# Patient Record
Sex: Female | Born: 1935 | Race: White | Hispanic: No | Smoking: Never smoker
Health system: Southern US, Community
[De-identification: ages and names within clinical notes are randomized; demographics above are authoritative.]

## PROBLEM LIST (undated history)

## (undated) DIAGNOSIS — Z951 Presence of aortocoronary bypass graft: Secondary | ICD-10-CM

## (undated) DIAGNOSIS — I10 Essential (primary) hypertension: Secondary | ICD-10-CM

## (undated) DIAGNOSIS — F419 Anxiety disorder, unspecified: Secondary | ICD-10-CM

## (undated) DIAGNOSIS — E785 Hyperlipidemia, unspecified: Secondary | ICD-10-CM

## (undated) HISTORY — PX: BREAST EXCISIONAL BIOPSY: SUR124

---

## 2000-02-19 HISTORY — PX: CORONARY ARTERY BYPASS GRAFT: SHX141

## 2016-03-18 DIAGNOSIS — L578 Other skin changes due to chronic exposure to nonionizing radiation: Secondary | ICD-10-CM | POA: Diagnosis not present

## 2016-03-18 DIAGNOSIS — Z872 Personal history of diseases of the skin and subcutaneous tissue: Secondary | ICD-10-CM | POA: Diagnosis not present

## 2016-03-18 DIAGNOSIS — B0229 Other postherpetic nervous system involvement: Secondary | ICD-10-CM | POA: Diagnosis not present

## 2016-03-18 DIAGNOSIS — Z08 Encounter for follow-up examination after completed treatment for malignant neoplasm: Secondary | ICD-10-CM | POA: Diagnosis not present

## 2016-03-18 DIAGNOSIS — X32XXXA Exposure to sunlight, initial encounter: Secondary | ICD-10-CM | POA: Diagnosis not present

## 2016-03-18 DIAGNOSIS — L814 Other melanin hyperpigmentation: Secondary | ICD-10-CM | POA: Diagnosis not present

## 2016-03-18 DIAGNOSIS — L821 Other seborrheic keratosis: Secondary | ICD-10-CM | POA: Diagnosis not present

## 2016-03-18 DIAGNOSIS — Z85828 Personal history of other malignant neoplasm of skin: Secondary | ICD-10-CM | POA: Diagnosis not present

## 2016-05-16 DIAGNOSIS — I87321 Chronic venous hypertension (idiopathic) with inflammation of right lower extremity: Secondary | ICD-10-CM | POA: Diagnosis not present

## 2016-05-16 DIAGNOSIS — I2510 Atherosclerotic heart disease of native coronary artery without angina pectoris: Secondary | ICD-10-CM | POA: Diagnosis not present

## 2016-05-16 DIAGNOSIS — I83893 Varicose veins of bilateral lower extremities with other complications: Secondary | ICD-10-CM | POA: Diagnosis not present

## 2016-05-27 DIAGNOSIS — I83893 Varicose veins of bilateral lower extremities with other complications: Secondary | ICD-10-CM | POA: Diagnosis not present

## 2016-05-30 DIAGNOSIS — I10 Essential (primary) hypertension: Secondary | ICD-10-CM | POA: Diagnosis not present

## 2016-05-30 DIAGNOSIS — E559 Vitamin D deficiency, unspecified: Secondary | ICD-10-CM | POA: Diagnosis not present

## 2016-05-30 DIAGNOSIS — Z803 Family history of malignant neoplasm of breast: Secondary | ICD-10-CM | POA: Diagnosis not present

## 2016-05-30 DIAGNOSIS — Z6823 Body mass index (BMI) 23.0-23.9, adult: Secondary | ICD-10-CM | POA: Diagnosis not present

## 2016-05-30 DIAGNOSIS — I2510 Atherosclerotic heart disease of native coronary artery without angina pectoris: Secondary | ICD-10-CM | POA: Diagnosis not present

## 2016-05-30 DIAGNOSIS — C189 Malignant neoplasm of colon, unspecified: Secondary | ICD-10-CM | POA: Diagnosis not present

## 2016-05-30 DIAGNOSIS — F419 Anxiety disorder, unspecified: Secondary | ICD-10-CM | POA: Diagnosis not present

## 2016-05-30 DIAGNOSIS — M1990 Unspecified osteoarthritis, unspecified site: Secondary | ICD-10-CM | POA: Diagnosis not present

## 2016-05-30 DIAGNOSIS — E785 Hyperlipidemia, unspecified: Secondary | ICD-10-CM | POA: Diagnosis not present

## 2016-05-30 DIAGNOSIS — Z859 Personal history of malignant neoplasm, unspecified: Secondary | ICD-10-CM | POA: Diagnosis not present

## 2016-05-30 DIAGNOSIS — M810 Age-related osteoporosis without current pathological fracture: Secondary | ICD-10-CM | POA: Diagnosis not present

## 2016-06-04 DIAGNOSIS — R970 Elevated carcinoembryonic antigen [CEA]: Secondary | ICD-10-CM | POA: Diagnosis not present

## 2016-06-06 DIAGNOSIS — I10 Essential (primary) hypertension: Secondary | ICD-10-CM | POA: Diagnosis not present

## 2016-06-06 DIAGNOSIS — I83893 Varicose veins of bilateral lower extremities with other complications: Secondary | ICD-10-CM | POA: Diagnosis not present

## 2016-06-06 DIAGNOSIS — I87321 Chronic venous hypertension (idiopathic) with inflammation of right lower extremity: Secondary | ICD-10-CM | POA: Diagnosis not present

## 2016-06-06 DIAGNOSIS — E782 Mixed hyperlipidemia: Secondary | ICD-10-CM | POA: Diagnosis not present

## 2016-06-06 DIAGNOSIS — R600 Localized edema: Secondary | ICD-10-CM | POA: Diagnosis not present

## 2016-06-12 DIAGNOSIS — I872 Venous insufficiency (chronic) (peripheral): Secondary | ICD-10-CM | POA: Diagnosis not present

## 2016-06-12 DIAGNOSIS — I83893 Varicose veins of bilateral lower extremities with other complications: Secondary | ICD-10-CM | POA: Diagnosis not present

## 2016-07-10 DIAGNOSIS — I872 Venous insufficiency (chronic) (peripheral): Secondary | ICD-10-CM | POA: Diagnosis not present

## 2016-08-06 DIAGNOSIS — I83893 Varicose veins of bilateral lower extremities with other complications: Secondary | ICD-10-CM | POA: Diagnosis not present

## 2016-08-06 DIAGNOSIS — Z96652 Presence of left artificial knee joint: Secondary | ICD-10-CM | POA: Diagnosis not present

## 2016-08-06 DIAGNOSIS — Z85038 Personal history of other malignant neoplasm of large intestine: Secondary | ICD-10-CM | POA: Diagnosis not present

## 2016-08-16 DIAGNOSIS — M1990 Unspecified osteoarthritis, unspecified site: Secondary | ICD-10-CM | POA: Diagnosis not present

## 2016-08-16 DIAGNOSIS — E559 Vitamin D deficiency, unspecified: Secondary | ICD-10-CM | POA: Diagnosis not present

## 2016-08-16 DIAGNOSIS — M810 Age-related osteoporosis without current pathological fracture: Secondary | ICD-10-CM | POA: Diagnosis not present

## 2016-08-16 DIAGNOSIS — Z85038 Personal history of other malignant neoplasm of large intestine: Secondary | ICD-10-CM | POA: Diagnosis not present

## 2016-08-16 DIAGNOSIS — Z0001 Encounter for general adult medical examination with abnormal findings: Secondary | ICD-10-CM | POA: Diagnosis not present

## 2016-08-16 DIAGNOSIS — Z803 Family history of malignant neoplasm of breast: Secondary | ICD-10-CM | POA: Diagnosis not present

## 2016-08-16 DIAGNOSIS — I2510 Atherosclerotic heart disease of native coronary artery without angina pectoris: Secondary | ICD-10-CM | POA: Diagnosis not present

## 2016-08-16 DIAGNOSIS — F419 Anxiety disorder, unspecified: Secondary | ICD-10-CM | POA: Diagnosis not present

## 2016-08-16 DIAGNOSIS — E785 Hyperlipidemia, unspecified: Secondary | ICD-10-CM | POA: Diagnosis not present

## 2016-08-16 DIAGNOSIS — I10 Essential (primary) hypertension: Secondary | ICD-10-CM | POA: Diagnosis not present

## 2016-08-19 DIAGNOSIS — Z951 Presence of aortocoronary bypass graft: Secondary | ICD-10-CM | POA: Diagnosis not present

## 2016-08-19 DIAGNOSIS — R600 Localized edema: Secondary | ICD-10-CM | POA: Diagnosis not present

## 2016-08-19 DIAGNOSIS — I872 Venous insufficiency (chronic) (peripheral): Secondary | ICD-10-CM | POA: Diagnosis not present

## 2016-08-19 DIAGNOSIS — I10 Essential (primary) hypertension: Secondary | ICD-10-CM | POA: Diagnosis not present

## 2016-09-16 DIAGNOSIS — Z08 Encounter for follow-up examination after completed treatment for malignant neoplasm: Secondary | ICD-10-CM | POA: Diagnosis not present

## 2016-09-16 DIAGNOSIS — Z872 Personal history of diseases of the skin and subcutaneous tissue: Secondary | ICD-10-CM | POA: Diagnosis not present

## 2016-09-16 DIAGNOSIS — L578 Other skin changes due to chronic exposure to nonionizing radiation: Secondary | ICD-10-CM | POA: Diagnosis not present

## 2016-09-16 DIAGNOSIS — L821 Other seborrheic keratosis: Secondary | ICD-10-CM | POA: Diagnosis not present

## 2016-09-16 DIAGNOSIS — X32XXXA Exposure to sunlight, initial encounter: Secondary | ICD-10-CM | POA: Diagnosis not present

## 2016-09-16 DIAGNOSIS — Z85828 Personal history of other malignant neoplasm of skin: Secondary | ICD-10-CM | POA: Diagnosis not present

## 2016-09-18 DIAGNOSIS — N644 Mastodynia: Secondary | ICD-10-CM | POA: Diagnosis not present

## 2016-09-26 DIAGNOSIS — M2042 Other hammer toe(s) (acquired), left foot: Secondary | ICD-10-CM | POA: Diagnosis not present

## 2016-09-26 DIAGNOSIS — L600 Ingrowing nail: Secondary | ICD-10-CM | POA: Diagnosis not present

## 2016-09-26 DIAGNOSIS — M2012 Hallux valgus (acquired), left foot: Secondary | ICD-10-CM | POA: Diagnosis not present

## 2016-11-21 DIAGNOSIS — I890 Lymphedema, not elsewhere classified: Secondary | ICD-10-CM | POA: Diagnosis not present

## 2016-11-21 DIAGNOSIS — L89891 Pressure ulcer of other site, stage 1: Secondary | ICD-10-CM | POA: Diagnosis not present

## 2016-12-05 DIAGNOSIS — C189 Malignant neoplasm of colon, unspecified: Secondary | ICD-10-CM | POA: Diagnosis not present

## 2016-12-05 DIAGNOSIS — Z859 Personal history of malignant neoplasm, unspecified: Secondary | ICD-10-CM | POA: Diagnosis not present

## 2016-12-16 DIAGNOSIS — E7800 Pure hypercholesterolemia, unspecified: Secondary | ICD-10-CM | POA: Diagnosis not present

## 2016-12-16 DIAGNOSIS — M810 Age-related osteoporosis without current pathological fracture: Secondary | ICD-10-CM | POA: Diagnosis not present

## 2016-12-16 DIAGNOSIS — F419 Anxiety disorder, unspecified: Secondary | ICD-10-CM | POA: Diagnosis not present

## 2016-12-16 DIAGNOSIS — I10 Essential (primary) hypertension: Secondary | ICD-10-CM | POA: Diagnosis not present

## 2016-12-16 DIAGNOSIS — M159 Polyosteoarthritis, unspecified: Secondary | ICD-10-CM | POA: Diagnosis not present

## 2016-12-16 DIAGNOSIS — Z23 Encounter for immunization: Secondary | ICD-10-CM | POA: Diagnosis not present

## 2016-12-16 DIAGNOSIS — Z809 Family history of malignant neoplasm, unspecified: Secondary | ICD-10-CM | POA: Diagnosis not present

## 2016-12-16 DIAGNOSIS — D010 Carcinoma in situ of colon: Secondary | ICD-10-CM | POA: Diagnosis not present

## 2016-12-16 DIAGNOSIS — I2510 Atherosclerotic heart disease of native coronary artery without angina pectoris: Secondary | ICD-10-CM | POA: Diagnosis not present

## 2016-12-24 DIAGNOSIS — I872 Venous insufficiency (chronic) (peripheral): Secondary | ICD-10-CM | POA: Diagnosis not present

## 2016-12-24 DIAGNOSIS — I351 Nonrheumatic aortic (valve) insufficiency: Secondary | ICD-10-CM | POA: Diagnosis not present

## 2016-12-24 DIAGNOSIS — I2510 Atherosclerotic heart disease of native coronary artery without angina pectoris: Secondary | ICD-10-CM | POA: Diagnosis not present

## 2016-12-24 DIAGNOSIS — R600 Localized edema: Secondary | ICD-10-CM | POA: Diagnosis not present

## 2016-12-24 DIAGNOSIS — I340 Nonrheumatic mitral (valve) insufficiency: Secondary | ICD-10-CM | POA: Diagnosis not present

## 2016-12-25 DIAGNOSIS — R600 Localized edema: Secondary | ICD-10-CM | POA: Diagnosis not present

## 2017-01-01 DIAGNOSIS — K5790 Diverticulosis of intestine, part unspecified, without perforation or abscess without bleeding: Secondary | ICD-10-CM | POA: Diagnosis not present

## 2017-01-01 DIAGNOSIS — R970 Elevated carcinoembryonic antigen [CEA]: Secondary | ICD-10-CM | POA: Diagnosis not present

## 2017-01-07 DIAGNOSIS — I2510 Atherosclerotic heart disease of native coronary artery without angina pectoris: Secondary | ICD-10-CM | POA: Diagnosis not present

## 2017-01-22 DIAGNOSIS — R600 Localized edema: Secondary | ICD-10-CM | POA: Diagnosis not present

## 2017-01-22 DIAGNOSIS — I83893 Varicose veins of bilateral lower extremities with other complications: Secondary | ICD-10-CM | POA: Diagnosis not present

## 2017-01-22 DIAGNOSIS — I208 Other forms of angina pectoris: Secondary | ICD-10-CM | POA: Diagnosis not present

## 2017-01-23 DIAGNOSIS — I890 Lymphedema, not elsewhere classified: Secondary | ICD-10-CM | POA: Diagnosis not present

## 2017-01-23 DIAGNOSIS — L89891 Pressure ulcer of other site, stage 1: Secondary | ICD-10-CM | POA: Diagnosis not present

## 2017-02-13 DIAGNOSIS — R970 Elevated carcinoembryonic antigen [CEA]: Secondary | ICD-10-CM | POA: Diagnosis not present

## 2017-02-13 DIAGNOSIS — Z85038 Personal history of other malignant neoplasm of large intestine: Secondary | ICD-10-CM | POA: Diagnosis not present

## 2017-02-13 DIAGNOSIS — E119 Type 2 diabetes mellitus without complications: Secondary | ICD-10-CM | POA: Diagnosis not present

## 2017-02-13 DIAGNOSIS — Z79899 Other long term (current) drug therapy: Secondary | ICD-10-CM | POA: Diagnosis not present

## 2017-02-13 DIAGNOSIS — R933 Abnormal findings on diagnostic imaging of other parts of digestive tract: Secondary | ICD-10-CM | POA: Diagnosis not present

## 2017-02-13 DIAGNOSIS — I10 Essential (primary) hypertension: Secondary | ICD-10-CM | POA: Diagnosis not present

## 2017-02-13 DIAGNOSIS — Z800 Family history of malignant neoplasm of digestive organs: Secondary | ICD-10-CM | POA: Diagnosis not present

## 2017-02-13 DIAGNOSIS — I2510 Atherosclerotic heart disease of native coronary artery without angina pectoris: Secondary | ICD-10-CM | POA: Diagnosis not present

## 2017-02-13 DIAGNOSIS — R935 Abnormal findings on diagnostic imaging of other abdominal regions, including retroperitoneum: Secondary | ICD-10-CM | POA: Diagnosis not present

## 2017-06-19 DIAGNOSIS — M810 Age-related osteoporosis without current pathological fracture: Secondary | ICD-10-CM | POA: Diagnosis not present

## 2017-06-19 DIAGNOSIS — I2510 Atherosclerotic heart disease of native coronary artery without angina pectoris: Secondary | ICD-10-CM | POA: Diagnosis not present

## 2017-06-19 DIAGNOSIS — M1990 Unspecified osteoarthritis, unspecified site: Secondary | ICD-10-CM | POA: Diagnosis not present

## 2017-06-19 DIAGNOSIS — F419 Anxiety disorder, unspecified: Secondary | ICD-10-CM | POA: Diagnosis not present

## 2017-06-19 DIAGNOSIS — I10 Essential (primary) hypertension: Secondary | ICD-10-CM | POA: Diagnosis not present

## 2017-06-19 DIAGNOSIS — Z951 Presence of aortocoronary bypass graft: Secondary | ICD-10-CM | POA: Diagnosis not present

## 2017-06-23 DIAGNOSIS — I2510 Atherosclerotic heart disease of native coronary artery without angina pectoris: Secondary | ICD-10-CM | POA: Diagnosis not present

## 2017-06-23 DIAGNOSIS — I10 Essential (primary) hypertension: Secondary | ICD-10-CM | POA: Diagnosis not present

## 2017-06-23 DIAGNOSIS — Z951 Presence of aortocoronary bypass graft: Secondary | ICD-10-CM | POA: Diagnosis not present

## 2017-06-23 DIAGNOSIS — M810 Age-related osteoporosis without current pathological fracture: Secondary | ICD-10-CM | POA: Diagnosis not present

## 2017-06-23 DIAGNOSIS — F419 Anxiety disorder, unspecified: Secondary | ICD-10-CM | POA: Diagnosis not present

## 2017-06-23 DIAGNOSIS — M1990 Unspecified osteoarthritis, unspecified site: Secondary | ICD-10-CM | POA: Diagnosis not present

## 2017-06-24 DIAGNOSIS — J3501 Chronic tonsillitis: Secondary | ICD-10-CM | POA: Diagnosis not present

## 2017-06-24 DIAGNOSIS — J351 Hypertrophy of tonsils: Secondary | ICD-10-CM | POA: Diagnosis not present

## 2017-06-26 DIAGNOSIS — Z951 Presence of aortocoronary bypass graft: Secondary | ICD-10-CM | POA: Diagnosis not present

## 2017-06-26 DIAGNOSIS — I2510 Atherosclerotic heart disease of native coronary artery without angina pectoris: Secondary | ICD-10-CM | POA: Diagnosis not present

## 2017-06-26 DIAGNOSIS — F419 Anxiety disorder, unspecified: Secondary | ICD-10-CM | POA: Diagnosis not present

## 2017-06-26 DIAGNOSIS — E7800 Pure hypercholesterolemia, unspecified: Secondary | ICD-10-CM | POA: Diagnosis not present

## 2017-06-26 DIAGNOSIS — R5383 Other fatigue: Secondary | ICD-10-CM | POA: Diagnosis not present

## 2017-06-26 DIAGNOSIS — Z85038 Personal history of other malignant neoplasm of large intestine: Secondary | ICD-10-CM | POA: Diagnosis not present

## 2017-06-26 DIAGNOSIS — I10 Essential (primary) hypertension: Secondary | ICD-10-CM | POA: Diagnosis not present

## 2017-06-26 DIAGNOSIS — M1990 Unspecified osteoarthritis, unspecified site: Secondary | ICD-10-CM | POA: Diagnosis not present

## 2017-06-26 DIAGNOSIS — M810 Age-related osteoporosis without current pathological fracture: Secondary | ICD-10-CM | POA: Diagnosis not present

## 2017-06-26 DIAGNOSIS — E559 Vitamin D deficiency, unspecified: Secondary | ICD-10-CM | POA: Diagnosis not present

## 2017-07-01 DIAGNOSIS — M810 Age-related osteoporosis without current pathological fracture: Secondary | ICD-10-CM | POA: Diagnosis not present

## 2017-07-01 DIAGNOSIS — I2510 Atherosclerotic heart disease of native coronary artery without angina pectoris: Secondary | ICD-10-CM | POA: Diagnosis not present

## 2017-07-01 DIAGNOSIS — Z951 Presence of aortocoronary bypass graft: Secondary | ICD-10-CM | POA: Diagnosis not present

## 2017-07-01 DIAGNOSIS — M1990 Unspecified osteoarthritis, unspecified site: Secondary | ICD-10-CM | POA: Diagnosis not present

## 2017-07-01 DIAGNOSIS — F419 Anxiety disorder, unspecified: Secondary | ICD-10-CM | POA: Diagnosis not present

## 2017-07-01 DIAGNOSIS — I10 Essential (primary) hypertension: Secondary | ICD-10-CM | POA: Diagnosis not present

## 2017-07-03 DIAGNOSIS — I10 Essential (primary) hypertension: Secondary | ICD-10-CM | POA: Diagnosis not present

## 2017-07-03 DIAGNOSIS — F419 Anxiety disorder, unspecified: Secondary | ICD-10-CM | POA: Diagnosis not present

## 2017-07-03 DIAGNOSIS — M810 Age-related osteoporosis without current pathological fracture: Secondary | ICD-10-CM | POA: Diagnosis not present

## 2017-07-03 DIAGNOSIS — M1990 Unspecified osteoarthritis, unspecified site: Secondary | ICD-10-CM | POA: Diagnosis not present

## 2017-07-03 DIAGNOSIS — Z951 Presence of aortocoronary bypass graft: Secondary | ICD-10-CM | POA: Diagnosis not present

## 2017-07-03 DIAGNOSIS — I2510 Atherosclerotic heart disease of native coronary artery without angina pectoris: Secondary | ICD-10-CM | POA: Diagnosis not present

## 2017-07-08 DIAGNOSIS — I10 Essential (primary) hypertension: Secondary | ICD-10-CM | POA: Diagnosis not present

## 2017-07-08 DIAGNOSIS — I2510 Atherosclerotic heart disease of native coronary artery without angina pectoris: Secondary | ICD-10-CM | POA: Diagnosis not present

## 2017-07-08 DIAGNOSIS — Z951 Presence of aortocoronary bypass graft: Secondary | ICD-10-CM | POA: Diagnosis not present

## 2017-07-08 DIAGNOSIS — M810 Age-related osteoporosis without current pathological fracture: Secondary | ICD-10-CM | POA: Diagnosis not present

## 2017-07-08 DIAGNOSIS — F419 Anxiety disorder, unspecified: Secondary | ICD-10-CM | POA: Diagnosis not present

## 2017-07-08 DIAGNOSIS — M1990 Unspecified osteoarthritis, unspecified site: Secondary | ICD-10-CM | POA: Diagnosis not present

## 2017-07-09 DIAGNOSIS — F341 Dysthymic disorder: Secondary | ICD-10-CM | POA: Diagnosis not present

## 2017-07-09 DIAGNOSIS — E782 Mixed hyperlipidemia: Secondary | ICD-10-CM | POA: Diagnosis not present

## 2017-07-09 DIAGNOSIS — I10 Essential (primary) hypertension: Secondary | ICD-10-CM | POA: Diagnosis not present

## 2017-07-09 DIAGNOSIS — R600 Localized edema: Secondary | ICD-10-CM | POA: Diagnosis not present

## 2017-07-09 DIAGNOSIS — I208 Other forms of angina pectoris: Secondary | ICD-10-CM | POA: Diagnosis not present

## 2017-07-10 DIAGNOSIS — M1990 Unspecified osteoarthritis, unspecified site: Secondary | ICD-10-CM | POA: Diagnosis not present

## 2017-07-10 DIAGNOSIS — M810 Age-related osteoporosis without current pathological fracture: Secondary | ICD-10-CM | POA: Diagnosis not present

## 2017-07-10 DIAGNOSIS — F419 Anxiety disorder, unspecified: Secondary | ICD-10-CM | POA: Diagnosis not present

## 2017-07-10 DIAGNOSIS — I10 Essential (primary) hypertension: Secondary | ICD-10-CM | POA: Diagnosis not present

## 2017-07-10 DIAGNOSIS — I2510 Atherosclerotic heart disease of native coronary artery without angina pectoris: Secondary | ICD-10-CM | POA: Diagnosis not present

## 2017-07-10 DIAGNOSIS — Z951 Presence of aortocoronary bypass graft: Secondary | ICD-10-CM | POA: Diagnosis not present

## 2017-07-16 DIAGNOSIS — Z951 Presence of aortocoronary bypass graft: Secondary | ICD-10-CM | POA: Diagnosis not present

## 2017-07-16 DIAGNOSIS — M810 Age-related osteoporosis without current pathological fracture: Secondary | ICD-10-CM | POA: Diagnosis not present

## 2017-07-16 DIAGNOSIS — F419 Anxiety disorder, unspecified: Secondary | ICD-10-CM | POA: Diagnosis not present

## 2017-07-16 DIAGNOSIS — I10 Essential (primary) hypertension: Secondary | ICD-10-CM | POA: Diagnosis not present

## 2017-07-16 DIAGNOSIS — M1990 Unspecified osteoarthritis, unspecified site: Secondary | ICD-10-CM | POA: Diagnosis not present

## 2017-07-16 DIAGNOSIS — I2510 Atherosclerotic heart disease of native coronary artery without angina pectoris: Secondary | ICD-10-CM | POA: Diagnosis not present

## 2017-07-21 DIAGNOSIS — Z0001 Encounter for general adult medical examination with abnormal findings: Secondary | ICD-10-CM | POA: Diagnosis not present

## 2017-07-21 DIAGNOSIS — Z6821 Body mass index (BMI) 21.0-21.9, adult: Secondary | ICD-10-CM | POA: Diagnosis not present

## 2017-07-21 DIAGNOSIS — Z85038 Personal history of other malignant neoplasm of large intestine: Secondary | ICD-10-CM | POA: Diagnosis not present

## 2017-07-21 DIAGNOSIS — I10 Essential (primary) hypertension: Secondary | ICD-10-CM | POA: Diagnosis not present

## 2017-07-21 DIAGNOSIS — H903 Sensorineural hearing loss, bilateral: Secondary | ICD-10-CM | POA: Diagnosis not present

## 2017-07-21 DIAGNOSIS — M810 Age-related osteoporosis without current pathological fracture: Secondary | ICD-10-CM | POA: Diagnosis not present

## 2017-07-22 DIAGNOSIS — I10 Essential (primary) hypertension: Secondary | ICD-10-CM | POA: Diagnosis not present

## 2017-07-22 DIAGNOSIS — M1990 Unspecified osteoarthritis, unspecified site: Secondary | ICD-10-CM | POA: Diagnosis not present

## 2017-07-22 DIAGNOSIS — M810 Age-related osteoporosis without current pathological fracture: Secondary | ICD-10-CM | POA: Diagnosis not present

## 2017-07-22 DIAGNOSIS — I2510 Atherosclerotic heart disease of native coronary artery without angina pectoris: Secondary | ICD-10-CM | POA: Diagnosis not present

## 2017-07-22 DIAGNOSIS — F419 Anxiety disorder, unspecified: Secondary | ICD-10-CM | POA: Diagnosis not present

## 2017-07-22 DIAGNOSIS — Z951 Presence of aortocoronary bypass graft: Secondary | ICD-10-CM | POA: Diagnosis not present

## 2017-08-26 DIAGNOSIS — Z7189 Other specified counseling: Secondary | ICD-10-CM | POA: Diagnosis not present

## 2017-08-26 DIAGNOSIS — I10 Essential (primary) hypertension: Secondary | ICD-10-CM | POA: Diagnosis not present

## 2017-08-26 DIAGNOSIS — E7800 Pure hypercholesterolemia, unspecified: Secondary | ICD-10-CM | POA: Diagnosis not present

## 2017-08-26 DIAGNOSIS — R011 Cardiac murmur, unspecified: Secondary | ICD-10-CM | POA: Diagnosis not present

## 2017-09-23 ENCOUNTER — Ambulatory Visit: Payer: Self-pay | Admitting: Podiatry

## 2017-10-09 ENCOUNTER — Ambulatory Visit: Payer: Self-pay | Admitting: Podiatry

## 2017-10-14 ENCOUNTER — Ambulatory Visit: Payer: Medicare Other | Admitting: Podiatry

## 2017-10-14 DIAGNOSIS — Z951 Presence of aortocoronary bypass graft: Secondary | ICD-10-CM | POA: Insufficient documentation

## 2017-10-14 DIAGNOSIS — M7742 Metatarsalgia, left foot: Secondary | ICD-10-CM

## 2017-10-14 DIAGNOSIS — M79674 Pain in right toe(s): Secondary | ICD-10-CM

## 2017-10-14 DIAGNOSIS — Z96653 Presence of artificial knee joint, bilateral: Secondary | ICD-10-CM | POA: Insufficient documentation

## 2017-10-14 DIAGNOSIS — B351 Tinea unguium: Secondary | ICD-10-CM | POA: Diagnosis not present

## 2017-10-14 DIAGNOSIS — M79675 Pain in left toe(s): Secondary | ICD-10-CM | POA: Diagnosis not present

## 2017-10-14 DIAGNOSIS — M7741 Metatarsalgia, right foot: Secondary | ICD-10-CM

## 2017-10-14 DIAGNOSIS — Z90710 Acquired absence of both cervix and uterus: Secondary | ICD-10-CM | POA: Insufficient documentation

## 2017-10-14 DIAGNOSIS — C189 Malignant neoplasm of colon, unspecified: Secondary | ICD-10-CM | POA: Insufficient documentation

## 2017-10-14 DIAGNOSIS — M216X9 Other acquired deformities of unspecified foot: Secondary | ICD-10-CM

## 2017-10-16 NOTE — Progress Notes (Signed)
Subjective:   Patient ID: Christy Marquez, female   DOB: 84 y.o.   MRN: [id number]   HPI 84 year old female presents the office today for concerns of thick, painful, elongated nails that she cannot trim herself.  She also has some burning to her feet she gets some discomfort submetatarsal area.  She recently just moved here from [place] and she was seen a podiatrist down there.  She is not diabetic.  She has no other concerns today.  Review of Systems  All other systems reviewed and are negative.  No past medical history on file.     Current Outpatient Medications:  .  ALPRAZolam (XANAX) 0.5 MG tablet, Take 0.5 mg by mouth at bedtime as needed for anxiety., Disp: , Rfl:  .  cloNIDine (CATAPRES - DOSED IN MG/24 HR) 0.2 mg/24hr patch, Place 0.2 mg onto the skin once a week., Disp: , Rfl:  .  furosemide (LASIX) 20 MG tablet, Take 20 mg by mouth., Disp: , Rfl:  .  irbesartan (AVAPRO) 150 MG tablet, Take 150 mg by mouth daily., Disp: , Rfl:  .  metoprolol succinate (TOPROL-XL) 100 MG 24 hr tablet, Take 100 mg by mouth daily. Take with or immediately following a meal., Disp: , Rfl:  .  potassium chloride (K-DUR,KLOR-CON) 10 MEQ tablet, Take 10 mEq by mouth 2 (two) times daily., Disp: , Rfl:  .  simvastatin (ZOCOR) 20 MG tablet, Take 20 mg by mouth daily., Disp: , Rfl:  .  traMADol (ULTRAM) 50 MG tablet, Take by mouth every 6 (six) hours as needed., Disp: , Rfl:   Allergies  Allergen Reactions  . Darvon [Propoxyphene]   . Demerol [Meperidine Hcl]          Objective:  Physical Exam  General: AAO x3, NAD  Dermatological: Nails are hypertrophic, dystrophic, brittle, discolored, elongated 10. No surrounding redness or drainage. Tenderness nails 1-5 bilaterally. No open lesions or pre-ulcerative lesions are identified today.  Vascular: Dorsalis Pedis artery and Posterior Tibial artery pedal pulses are 2/4 bilateral with immedate capillary fill time. There is no pain with calf  compression, swelling, warmth, erythema.   Neruologic: Grossly intact via light touch bilateral. Protective threshold with Semmes Wienstein monofilament intact to all pedal sites bilateral.   Musculoskeletal: Prominent metatarsal heads plantarly.  Subjectively this is where she gets burning on the balls of the foot.  There is no area pinpoint tenderness.  Muscular strength 5/5 in all groups tested bilateral.  Gait: Unassisted, Nonantalgic.       Assessment:   84 year old female symptomatic onychomycosis, metatarsalgia, neuritis symptoms     Plan:  -Treatment options discussed including all alternatives, risks, and complications -Etiology of symptoms were discussed -Nails were debrided x10 without any complications or bleeding. -Ultimately we discussed some kind of orthotic to help offload the metatarsal heads.  She is getting pain to this area and she was able to walk better.  We discussed metatarsal pads we also discussed an insert.  Rick evaluate her today for orthotics. -Daily foot inspection  Trula Slade DPM

## 2017-10-18 ENCOUNTER — Emergency Department (HOSPITAL_COMMUNITY)
Admission: EM | Admit: 2017-10-18 | Discharge: 2017-10-18 | Disposition: A | Discharge status: Home / Self Care | Payer: Medicare Other | Attending: Emergency Medicine | Admitting: Emergency Medicine

## 2017-10-18 ENCOUNTER — Encounter (HOSPITAL_COMMUNITY): Payer: Self-pay

## 2017-10-18 ENCOUNTER — Emergency Department (HOSPITAL_COMMUNITY): Payer: Medicare Other

## 2017-10-18 DIAGNOSIS — R0789 Other chest pain: Secondary | ICD-10-CM | POA: Diagnosis not present

## 2017-10-18 DIAGNOSIS — I10 Essential (primary) hypertension: Secondary | ICD-10-CM | POA: Diagnosis not present

## 2017-10-18 DIAGNOSIS — Z79899 Other long term (current) drug therapy: Secondary | ICD-10-CM | POA: Diagnosis not present

## 2017-10-18 DIAGNOSIS — R609 Edema, unspecified: Secondary | ICD-10-CM | POA: Diagnosis not present

## 2017-10-18 DIAGNOSIS — R079 Chest pain, unspecified: Secondary | ICD-10-CM | POA: Diagnosis not present

## 2017-10-18 DIAGNOSIS — R0902 Hypoxemia: Secondary | ICD-10-CM | POA: Diagnosis not present

## 2017-10-18 HISTORY — DX: Presence of aortocoronary bypass graft: Z95.1

## 2017-10-18 HISTORY — DX: Essential (primary) hypertension: I10

## 2017-10-18 HISTORY — DX: Hyperlipidemia, unspecified: E78.5

## 2017-10-18 HISTORY — DX: Anxiety disorder, unspecified: F41.9

## 2017-10-18 LAB — BASIC METABOLIC PANEL
Anion gap: 10 (ref 5–15)
BUN: 16 mg/dL (ref 8–23)
CO2: 28 mmol/L (ref 22–32)
Calcium: 9.3 mg/dL (ref 8.9–10.3)
Chloride: 99 mmol/L (ref 98–111)
Creatinine, Ser: 0.76 mg/dL (ref 0.44–1.00)
GFR calc Af Amer: >60 mL/min (ref >60)
GFR calc non Af Amer: >60 mL/min (ref >60)
Glucose, Bld: 97 mg/dL (ref 70–99)
Potassium: 4 mmol/L (ref 3.5–5.1)
Sodium: 137 mmol/L (ref 135–145)

## 2017-10-18 LAB — CBC WITH DIFFERENTIAL/PLATELET
Abs Immature Granulocytes: 0 10*3/uL (ref 0.0–0.1)
Basophils Absolute: 0.1 10*3/uL (ref 0.0–0.1)
Basophils Relative: 2 %
Eosinophils Absolute: 0.1 10*3/uL (ref 0.0–0.7)
Eosinophils Relative: 2 %
HCT: 39.2 % (ref 36.0–46.0)
Hemoglobin: 12.8 g/dL (ref 12.0–15.0)
Immature Granulocytes: 0 %
Lymphocytes Relative: 32 %
Lymphs Abs: 1.8 10*3/uL (ref 0.7–4.0)
MCH: 31.8 pg (ref 26.0–34.0)
MCHC: 32.7 g/dL (ref 30.0–36.0)
MCV: 97.3 fL (ref 78.0–100.0)
Monocytes Absolute: 0.4 10*3/uL (ref 0.1–1.0)
Monocytes Relative: 8 %
Neutro Abs: 3.1 10*3/uL (ref 1.7–7.7)
Neutrophils Relative %: 56 %
Platelets: 224 10*3/uL (ref 150–400)
RBC: 4.03 MIL/uL (ref 3.87–5.11)
RDW: 12.8 % (ref 11.5–15.5)
WBC: 5.5 10*3/uL (ref 4.0–10.5)

## 2017-10-18 LAB — I-STAT TROPONIN, ED
Troponin i, poc: 0.01 ng/mL (ref 0.00–0.08)
Troponin i, poc: 0.01 ng/mL (ref 0.00–0.08)

## 2017-10-18 MED ORDER — CLONIDINE HCL 0.1 MG PO TABS
0.1000 mg | ORAL_TABLET | Freq: Every day | ORAL | Status: DC
  Administered 2017-10-18: 0.1 mg via ORAL
  Filled 2017-10-18: qty 1

## 2017-10-18 MED ORDER — METOPROLOL SUCCINATE ER 100 MG PO TB24
100.0000 mg | ORAL_TABLET | Freq: Every day | ORAL | Status: DC
  Filled 2017-10-18: qty 1

## 2017-10-18 NOTE — ED Notes (Signed)
Patient transported to X-ray 

## 2017-10-18 NOTE — ED Provider Notes (Signed)
[facility] EMERGENCY DEPARTMENT Provider Note   CSN: [id number] Arrival date & time: 10/18/17  9794     History   Chief Complaint Chief Complaint  Patient presents with  . Chest Pain    HPI Christy Marquez is a 84 y.o. female.  63 y oF with a chief complaint of hypertension.  Patient has been checking her blood pressure off and on and has been worried that it is been elevated.  This been an ongoing issue for her.  She talked about her living in [place] and having the same issue.  She had checked it multiple times at some points and went to the ED there and was treated.  She has seen her family physician somewhat recently for the same.  There are no changes to her medications.  She woke up this morning and felt that her blood pressure must be elevated and she has checked it and the systolic was in the 801K and the diastolic was in the 55V.  She was walking to the house and had a sharp pain to the left side of her chest that lasted for less than a second.  She otherwise denies any symptoms.  Denies shortness of breath denies headache denies neck pain denies unilateral numbness or weakness denies abdominal pain nausea or vomiting.  The history is provided by the patient.  Chest Pain   This is a new problem. The current episode started 3 to 5 hours ago. The problem occurs rarely. The problem has been resolved. The pain is present in the lateral region. The pain is at a severity of 10/10. The pain is severe. The quality of the pain is described as brief and sharp. The pain does not radiate. Duration of episode(s) is 1 second. Pertinent negatives include no dizziness, no fever, no headaches, no nausea, no palpitations, no shortness of breath and no vomiting. She has tried nothing for the symptoms. The treatment provided no relief.  Her past medical history is significant for MI.    Past Medical History:  Diagnosis Date  . Anxiety   . Hx of CABG    Per pt 2002  .  Hyperlipidemia   . Hypertension     Patient Active Problem List   Diagnosis Date Noted  . History of total bilateral knee replacement 10/14/2017  . Colon cancer ([place]) 10/14/2017  . History of heart bypass surgery 10/14/2017  . H/O: hysterectomy 10/14/2017    Past Surgical History:  Procedure Laterality Date  . CORONARY ARTERY BYPASS GRAFT  2002     OB History   None      Home Medications    Prior to Admission medications   Medication Sig Start Date End Date Taking? Authorizing Provider  ALPRAZolam Duanne Moron) 0.5 MG tablet Take 0.5 mg by mouth at bedtime as needed for anxiety.   Yes [provider]  busPIRone (BUSPAR) 5 MG tablet Take 5 mg by mouth 2 (two) times daily. 10/14/17  Yes [provider]  cloNIDine (CATAPRES) 0.1 MG tablet Take 0.1 mg by mouth daily.   Yes [provider]  furosemide (LASIX) 20 MG tablet Take 20 mg by mouth daily.    Yes [provider]  irbesartan (AVAPRO) 150 MG tablet Take 150 mg by mouth daily.   Yes [provider]  metoprolol succinate (TOPROL-XL) 100 MG 24 hr tablet Take 100 mg by mouth daily. Take with or immediately following a meal.   Yes [provider]  potassium  chloride (K-DUR,KLOR-CON) 10 MEQ tablet Take 10 mEq by mouth daily. With food   Yes [provider]  simvastatin (ZOCOR) 20 MG tablet Take 20 mg by mouth daily.   Yes [provider]  traMADol (ULTRAM) 50 MG tablet Take 50 mg by mouth 2 (two) times daily as needed for moderate pain.    Yes [provider]  cloNIDine (CATAPRES - DOSED IN MG/24 HR) 0.2 mg/24hr patch Place 0.2 mg onto the skin once a week.    [provider]    Family History History reviewed. No pertinent family history.  Social History Social History   Tobacco Use  . Smoking status: Not on file  Substance Use Topics  . Alcohol use: Not on file  . Drug use: Not on file     Allergies   Darvon [propoxyphene] and  Demerol [meperidine hcl]   Review of Systems Review of Systems  Constitutional: Negative for chills and fever.  HENT: Negative for congestion and rhinorrhea.   Eyes: Negative for redness and visual disturbance.  Respiratory: Negative for shortness of breath and wheezing.   Cardiovascular: Positive for chest pain. Negative for palpitations.  Gastrointestinal: Negative for nausea and vomiting.  Genitourinary: Negative for dysuria and urgency.  Musculoskeletal: Negative for arthralgias and myalgias.  Skin: Negative for pallor and wound.  Neurological: Negative for dizziness and headaches.     Physical Exam Updated Vital Signs BP (!) 189/98   Pulse 60   Resp 16   SpO2 96%   Physical Exam  Constitutional: She is oriented to person, place, and time. She appears well-developed and well-nourished. No distress.  HENT:  Head: Normocephalic and atraumatic.  Eyes: Pupils are equal, round, and reactive to light. EOM are normal.  Neck: Normal range of motion. Neck supple.  Cardiovascular: Normal rate and regular rhythm. Exam reveals no gallop and no friction rub.  No murmur heard. Pulmonary/Chest: Effort normal. She has no wheezes. She has no rales. She exhibits tenderness ( Palpation of the left anterior chest wall about the lateral clavicular line ribs 2 through 5 reproduce her pain).  Abdominal: Soft. She exhibits no distension. There is no tenderness.  Musculoskeletal: She exhibits no edema or tenderness.  Neurological: She is alert and oriented to person, place, and time.  Skin: Skin is warm and dry. She is not diaphoretic.  Psychiatric: She has a normal mood and affect. Her behavior is normal.  Nursing note and vitals reviewed.    ED Treatments / Results  Labs (all labs ordered are listed, but only abnormal results are displayed) Labs Reviewed  CBC WITH DIFFERENTIAL/PLATELET  BASIC METABOLIC PANEL  I-STAT TROPONIN, ED  I-STAT TROPONIN, ED    EKG EKG  Interpretation  Date/Time:  Saturday October 18 2017 09:01:47 EDT Ventricular Rate:  56 PR Interval:    QRS Duration: 104 QT Interval:  460 QTC Calculation: 444 R Axis:   33 Text Interpretation:  Sinus rhythm Borderline prolonged PR interval Probable LVH with secondary repol abnrm No old tracing to compare Confirmed by Deno Etienne [phone]) on 10/18/2017 9:42:08 AM   Radiology Dg Chest 2 View  Result Date: 10/18/2017 CLINICAL DATA:  Chest pain EXAM: CHEST - 2 VIEW COMPARISON:  None. FINDINGS: Normal heart size. Mild aortic tortuosity. Prior median sternotomy. Prominent interstitial markings that may be calcified bronchi. There is no edema, consolidation, effusion, or pneumothorax. Exaggerated thoracic kyphosis and osteopenia. IMPRESSION: No acute finding. Electronically Signed   By: Monte Fantasia M.D.   On: 10/18/2017  10:18    Procedures Procedures (including critical care time)  Medications Ordered in ED Medications  metoprolol succinate (TOPROL-XL) 24 hr tablet 100 mg (100 mg Oral Refused 10/18/17 1420)  cloNIDine (CATAPRES) tablet 0.1 mg (0.1 mg Oral Given 10/18/17 1417)     Initial Impression / Assessment and Plan / ED Course  I have reviewed the triage vital signs and the nursing notes.  Pertinent labs & imaging results that were available during my care of the patient were reviewed by me and considered in my medical decision making (see chart for details).     84 yo F with a chief complaint of hypertension.  The patient also had transient chest pain.  This pain is very atypical of ACS.  Is reproducible on exam.  Based on her age and risk factors I will obtain a delta troponin.  Her initial EKG is unremarkable chest x-ray with no pneumothorax or focal infiltrate.  The patient is mostly concerned about her blood pressure.  She has been checking it at home.  She has multiple medications and is supposed to check her blood pressure after taking it in the morning but she checked it  before taking her medicines and then wanted to come in here without taking her medicine so she could show [place] what the number was.  She has no neurologic deficit on my exam.  No edema, I feel unlikely that the patient is having a hypertensive emergency.  Delta negative.  D/c home.   3:15 PM:  I have discussed the diagnosis/risks/treatment options with the patient and family and believe the pt to be eligible for discharge home to follow-up with PCP. We also discussed returning to the ED immediately if new or worsening sx occur. We discussed the sx which are most concerning (e.g., sudden worsening pain, fever, inability to tolerate by mouth) that necessitate immediate return. Medications administered to the patient during their visit and any new prescriptions provided to the patient are listed below.  Medications given during this visit Medications  metoprolol succinate (TOPROL-XL) 24 hr tablet 100 mg (100 mg Oral Refused 10/18/17 1420)  cloNIDine (CATAPRES) tablet 0.1 mg (0.1 mg Oral Given 10/18/17 1417)      The patient appears reasonably screen and/or stabilized for discharge and I doubt any other medical condition or other [facility] requiring further screening, evaluation, or treatment in the ED at this time prior to discharge.    Final Clinical Impressions(s) / ED Diagnoses   Final diagnoses:  Atypical chest pain  Essential hypertension    ED Discharge Orders    None       Deno Etienne, DO 10/18/17 1515

## 2017-10-18 NOTE — Discharge Instructions (Signed)
Chest you blood pressure as directed by your PCP.  Return to the ed for worsening chest pain, shortness of breath.

## 2017-10-18 NOTE — ED Triage Notes (Signed)
Pt arrived via [facility]; pt from [facility] with c/o CP centralized , radiating to neck and L shoulder, starting at 0630. Pt with hx of CABG 2002. Pt also has hx of HTN and has been hypertensive last few days ago and took took clonidine with presence of family member who is a Marine scientist. 192/87, 60, 95% on RA, 98.1 T.

## 2017-10-18 NOTE — ED Notes (Signed)
Pt discharged from ED; instructions provided; Pt encouraged to return to ED if symptoms worsen and to f/u with PCP; Pt verbalized understanding of all instructions 

## 2017-11-03 ENCOUNTER — Telehealth: Payer: Self-pay | Admitting: Podiatry

## 2017-11-03 NOTE — Telephone Encounter (Signed)
pts niece Zigmund Gottron left message checking on status of orthotics.  I returned call and left a message that they have just came in and to call me back to schedule an appt.

## 2017-11-05 ENCOUNTER — Ambulatory Visit: Payer: Medicare Other | Admitting: Orthotics

## 2017-11-05 DIAGNOSIS — M216X9 Other acquired deformities of unspecified foot: Secondary | ICD-10-CM

## 2017-11-05 DIAGNOSIS — M7741 Metatarsalgia, right foot: Secondary | ICD-10-CM

## 2017-11-05 DIAGNOSIS — M7742 Metatarsalgia, left foot: Principal | ICD-10-CM

## 2017-11-05 NOTE — Progress Notes (Signed)
Patient came in today to pick up custom made foot orthotics.  The goals were accomplished and the patient reported no dissatisfaction with said orthotics.  Patient was advised of breakin period and how to report any issues. 

## 2017-11-24 ENCOUNTER — Other Ambulatory Visit: Payer: Self-pay | Admitting: Geriatric Medicine

## 2017-11-24 DIAGNOSIS — R1319 Other dysphagia: Secondary | ICD-10-CM

## 2017-11-24 DIAGNOSIS — R1310 Dysphagia, unspecified: Secondary | ICD-10-CM

## 2017-11-25 ENCOUNTER — Other Ambulatory Visit: Payer: Medicare Other

## 2017-11-28 ENCOUNTER — Ambulatory Visit
Admission: RE | Admit: 2017-11-28 | Discharge: 2017-11-28 | Disposition: A | Discharge status: Home / Self Care | Payer: Medicare Other | Source: Ambulatory Visit | Attending: Geriatric Medicine | Admitting: Geriatric Medicine

## 2017-11-28 ENCOUNTER — Other Ambulatory Visit: Payer: Medicare Other

## 2017-11-28 DIAGNOSIS — R1310 Dysphagia, unspecified: Secondary | ICD-10-CM

## 2017-11-28 DIAGNOSIS — R1319 Other dysphagia: Secondary | ICD-10-CM

## 2018-02-02 ENCOUNTER — Other Ambulatory Visit: Payer: Self-pay | Admitting: Geriatric Medicine

## 2018-02-02 DIAGNOSIS — Z1231 Encounter for screening mammogram for malignant neoplasm of breast: Secondary | ICD-10-CM

## 2018-03-11 ENCOUNTER — Ambulatory Visit: Payer: Medicare Other

## 2018-04-02 DIAGNOSIS — R079 Chest pain, unspecified: Secondary | ICD-10-CM | POA: Insufficient documentation

## 2018-04-02 DIAGNOSIS — I878 Other specified disorders of veins: Secondary | ICD-10-CM | POA: Insufficient documentation

## 2018-04-02 DIAGNOSIS — I10 Essential (primary) hypertension: Secondary | ICD-10-CM | POA: Insufficient documentation

## 2018-04-03 ENCOUNTER — Ambulatory Visit: Payer: Medicare Other

## 2018-05-18 ENCOUNTER — Inpatient Hospital Stay: Admission: RE | Admit: 2018-05-18 | Payer: Medicare Other | Source: Ambulatory Visit

## 2018-06-16 ENCOUNTER — Ambulatory Visit: Payer: Medicare Other

## 2018-06-30 DIAGNOSIS — E785 Hyperlipidemia, unspecified: Secondary | ICD-10-CM | POA: Insufficient documentation

## 2018-06-30 DIAGNOSIS — S72052A Unspecified fracture of head of left femur, initial encounter for closed fracture: Secondary | ICD-10-CM | POA: Insufficient documentation

## 2018-06-30 DIAGNOSIS — I2510 Atherosclerotic heart disease of native coronary artery without angina pectoris: Secondary | ICD-10-CM | POA: Insufficient documentation

## 2018-07-01 DIAGNOSIS — E876 Hypokalemia: Secondary | ICD-10-CM | POA: Insufficient documentation

## 2018-07-01 DIAGNOSIS — E871 Hypo-osmolality and hyponatremia: Secondary | ICD-10-CM | POA: Insufficient documentation

## 2018-08-08 ENCOUNTER — Emergency Department (INDEPENDENT_AMBULATORY_CARE_PROVIDER_SITE_OTHER): Payer: Medicare Other

## 2018-08-08 ENCOUNTER — Emergency Department
Admission: EM | Admit: 2018-08-08 | Discharge: 2018-08-08 | Disposition: A | Discharge status: Home / Self Care | Payer: Medicare Other | Source: Home / Self Care | Attending: Family Medicine | Admitting: Family Medicine

## 2018-08-08 ENCOUNTER — Other Ambulatory Visit: Payer: Self-pay

## 2018-08-08 DIAGNOSIS — R197 Diarrhea, unspecified: Secondary | ICD-10-CM | POA: Diagnosis not present

## 2018-08-08 DIAGNOSIS — R109 Unspecified abdominal pain: Secondary | ICD-10-CM

## 2018-08-08 LAB — POCT CBC W AUTO DIFF (K'VILLE URGENT CARE)

## 2018-08-08 MED ORDER — AZITHROMYCIN 250 MG PO TABS: ORAL_TABLET | ORAL | 0 refills | Status: DC

## 2018-08-08 NOTE — Discharge Instructions (Addendum)
Begin antibiotic after obtaining stool specimen for analysis.  Begin clear liquids (Pedialyte while having diarrhea) until improved, then advance to a [employer] (Bananas, Rice, Applesauce, Toast).  Then gradually resume a regular diet when tolerated.  Avoid milk products until well. If symptoms become significantly worse during the night or over the weekend, proceed to the local emergency room.  If symptoms become significantly worse during the night or over the weekend, proceed to the local emergency room.

## 2018-08-08 NOTE — ED Provider Notes (Signed)
[facility]    CSN: [id number] Arrival date & time: 08/08/18  1017     History   Chief Complaint Chief Complaint  Patient presents with  . Diarrhea    HPI Christy Marquez is a 85 y.o. female.   Patient complains of onset of water diarrhea and mild headache about 10 days ago.  She denies abdominal pain or nausea/vomiting.  During the past 3 to 4 days she has had chills and yesterday had low grade fever.  No urinary symptoms.  She had had a left hip replacement in May with good recovery.  She denies recent foreign travel, or drinking untreated water in a wilderness environment.  She denies recent antibiotic use.  She has a past history of colon CA in 1976.  Surgical history also of appendectomy and hysterectomy.  The history is provided by the patient and a caregiver.  Diarrhea Quality:  Watery Severity:  Moderate Onset quality:  Sudden Duration:  10 days Timing:  Intermittent Progression:  Improving Relieved by:  Nothing Worsened by:  Nothing Ineffective treatments:  None tried Associated symptoms: chills, diaphoresis, fever and headaches   Associated symptoms: no abdominal pain, no arthralgias, no recent cough, no myalgias, no URI and no vomiting   Risk factors: no recent antibiotic use, no sick contacts, no suspicious food intake and no travel to endemic areas     Past Medical History:  Diagnosis Date  . Anxiety   . Hx of CABG    Per pt 2002  . Hyperlipidemia   . Hypertension     Patient Active Problem List   Diagnosis Date Noted  . History of total bilateral knee replacement 10/14/2017  . Colon cancer ([place]) 10/14/2017  . History of heart bypass surgery 10/14/2017  . H/O: hysterectomy 10/14/2017    Past Surgical History:  Procedure Laterality Date  . CORONARY ARTERY BYPASS GRAFT  2002    OB History   No obstetric history on file.      Home Medications    Prior to Admission medications   Medication Sig Start Date End Date Taking?  Authorizing Provider  ALPRAZolam Duanne Moron) 0.5 MG tablet Take 0.5 mg by mouth at bedtime as needed for anxiety.    [provider]  azithromycin (ZITHROMAX Z-PAK) 250 MG tablet Take 2 tabs today; then begin one tab once daily for 4 more days. 08/08/18   Kandra Nicolas, MD  busPIRone (BUSPAR) 5 MG tablet Take 5 mg by mouth 2 (two) times daily. 10/14/17   [provider]  cloNIDine (CATAPRES - DOSED IN MG/24 HR) 0.2 mg/24hr patch Place 0.2 mg onto the skin once a week.    [provider]  cloNIDine (CATAPRES) 0.1 MG tablet Take 0.1 mg by mouth daily.    [provider]  furosemide (LASIX) 20 MG tablet Take 20 mg by mouth daily.     [provider]  irbesartan (AVAPRO) 150 MG tablet Take 150 mg by mouth daily.    [provider]  metoprolol succinate (TOPROL-XL) 100 MG 24 hr tablet Take 100 mg by mouth daily. Take with or immediately following a meal.    [provider]  potassium chloride (K-DUR,KLOR-CON) 10 MEQ tablet Take 10 mEq by mouth daily. With food    [provider]  simvastatin (ZOCOR) 20 MG tablet Take 20 mg by mouth daily.    [provider]  traMADol (ULTRAM) 50 MG tablet Take 50 mg by mouth 2 (two) times daily as  needed for moderate pain.     [provider]    Family History History reviewed. No pertinent family history.  Social History Social History   Tobacco Use  . Smoking status: Never Smoker  . Smokeless tobacco: Never Used  Substance Use Topics  . Alcohol use: Not on file  . Drug use: Not on file     Allergies   Darvon [propoxyphene] and Demerol [meperidine hcl]   Review of Systems Review of Systems  Constitutional: Positive for chills, diaphoresis and fever. Negative for activity change, appetite change, fatigue and unexpected weight change.  HENT: Negative.   Eyes: Negative.   Respiratory: Negative.   Cardiovascular: Negative.   Gastrointestinal: Positive for diarrhea.  Negative for abdominal pain and vomiting.  Genitourinary: Negative.   Musculoskeletal: Negative for arthralgias and myalgias.  Skin: Negative.   Neurological: Positive for headaches.     Physical Exam Triage Vital Signs ED Triage Vitals  Enc Vitals Group     BP 08/08/18 1127 (!) 166/75     Pulse Rate 08/08/18 1127 92     Resp 08/08/18 1127 18     Temp 08/08/18 1127 97.8 F (36.6 C)     Temp Source 08/08/18 1127 Oral     SpO2 08/08/18 1127 96 %     Weight --      Height --      Head Circumference --      Peak Flow --      Pain Score 08/08/18 1128 0     Pain Loc --      Pain Edu? --      Excl. in [place]? --    No data found.  Updated Vital Signs BP (!) 166/75 (BP Location: Left Arm)   Pulse 92   Temp 97.8 F (36.6 C) (Oral)   Resp 18   SpO2 96%   Visual Acuity Right Eye Distance:   Left Eye Distance:   Bilateral Distance:    Right Eye Near:   Left Eye Near:    Bilateral Near:     Physical Exam Nursing notes and Vital Signs reviewed. Appearance:  Patient appears stated age, and in no acute distress.    Eyes:  Pupils are equal, round, and reactive to light and accomodation.  Extraocular movement is intact.  Conjunctivae are not inflamed   Pharynx:  Normal; moist mucous membranes  Neck:  Supple.  No adenopathy Lungs:  Clear to auscultation.  Breath sounds are equal.  Moving air well. Heart:  Regular rate and rhythm without murmurs, rubs, or gallops.  Abdomen:  Mild diffuse tenderness without masses or hepatosplenomegaly.  Bowel sounds are present.  No CVA or flank tenderness.  Extremities:  No edema.  Skin:  No rash present.     UC Treatments / Results  Labs (all labs ordered are listed, but only abnormal results are displayed) Labs Reviewed  GASTROINTESTINAL PANEL BY PCR, STOOL (REPLACES STOOL CULTURE)  C DIFFICILE QUICK SCREEN W PCR REFLEX  OTHER LAB TEST  POCT CBC W AUTO DIFF ([facility]):  WBC 9.3; LY 21.0; MO 7.0; GR 72.0; Hgb 12.2; Platelets  355     EKG None  Radiology Dg Abdomen 1 View  Result Date: 08/08/2018 CLINICAL DATA:  Diarrhea 10 days with fever 3 days. Left-sided abdominal pain. History of colon cancer. EXAM: ABDOMEN - 1 VIEW COMPARISON:  None. FINDINGS: Bowel gas pattern is nonobstructive with several air-filled nondilated large and small bowel loops. No free peritoneal air. No  mass or mass effect. Degenerative change of the spine and hips. Multiple pelvic phleboliths are present. Hardware intact over the left femur. IMPRESSION: Nonspecific, nonobstructive bowel gas pattern. Electronically Signed   By: Marin Olp M.D.   On: 08/08/2018 12:28    Procedures Procedures (including critical care time)  Medications Ordered in UC Medications - No data to display  Initial Impression / Assessment and Plan / UC Course  I have reviewed the triage vital signs and the nursing notes.  Pertinent labs & imaging results that were available during my care of the patient were reviewed by me and considered in my medical decision making (see chart for details).    Obtain GI panel by PCR.  Begin empiric azithromycin. As a precaution, will check for COVID-19.  Followup with Family Doctor if not improved in about 5 days.   Final Clinical Impressions(s) / UC Diagnoses   Final diagnoses:  Diarrhea, unspecified type     Discharge Instructions     Begin antibiotic after obtaining stool specimen for analysis.  Begin clear liquids (Pedialyte while having diarrhea) until improved, then advance to a [employer] (Bananas, Rice, Applesauce, Toast).  Then gradually resume a regular diet when tolerated.  Avoid milk products until well. If symptoms become significantly worse during the night or over the weekend, proceed to the local emergency room.  If symptoms become significantly worse during the night or over the weekend, proceed to the local emergency room.      ED Prescriptions    Medication Sig Dispense Auth. Provider    azithromycin (ZITHROMAX Z-PAK) 250 MG tablet Take 2 tabs today; then begin one tab once daily for 4 more days. 6 tablet Kandra Nicolas, MD         Kandra Nicolas, MD 08/12/18 1249

## 2018-08-08 NOTE — ED Triage Notes (Signed)
Pt c/o diarrhea x 10 days. Intermittent headache as well. Care taker mentions having low grade fever last 2 days.

## 2018-08-08 NOTE — ED Notes (Signed)
Pt getting stool specimen at home to bring in.

## 2018-08-10 LAB — TIQ- AMBIGUOUS ORDER

## 2018-08-10 LAB — EXTRA SPECIMEN

## 2018-08-10 LAB — GASTROINTESTINAL PATHOGEN PNL

## 2018-08-11 ENCOUNTER — Ambulatory Visit: Payer: Medicare Other

## 2018-08-12 ENCOUNTER — Telehealth: Payer: Self-pay | Admitting: *Deleted

## 2018-08-12 NOTE — Telephone Encounter (Signed)
Spoke to Christy Marquez the pt is feeling much better. Advised her that her COVID test is still pending and per [facility] should result in 2-3 more days.

## 2018-08-12 NOTE — Telephone Encounter (Signed)
LM to call back. [facility] was unable to perform stool cx. Per Dr Assunta Found if she is still having Diarrhea recollect sample. If Diarrhea has resolved no need to re-collect.

## 2018-08-15 ENCOUNTER — Telehealth: Payer: Self-pay | Admitting: *Deleted

## 2018-08-15 LAB — SARS-COV-2 RNA,(COVID-19) QUALITATIVE NAAT: SARS CoV2 RNA: NOT DETECTED

## 2018-08-15 NOTE — Telephone Encounter (Signed)
Spoke to Christy Marquez results. Pt is feeling much better.

## 2018-09-07 ENCOUNTER — Other Ambulatory Visit: Payer: Self-pay

## 2018-09-07 ENCOUNTER — Encounter: Payer: Self-pay | Admitting: Podiatry

## 2018-09-07 ENCOUNTER — Ambulatory Visit: Payer: Medicare Other | Admitting: Podiatry

## 2018-09-07 VITALS — Temp 98.2°F

## 2018-09-07 DIAGNOSIS — M79675 Pain in left toe(s): Secondary | ICD-10-CM | POA: Diagnosis not present

## 2018-09-07 DIAGNOSIS — M79674 Pain in right toe(s): Secondary | ICD-10-CM

## 2018-09-07 DIAGNOSIS — B351 Tinea unguium: Secondary | ICD-10-CM | POA: Diagnosis not present

## 2018-09-07 DIAGNOSIS — L84 Corns and callosities: Secondary | ICD-10-CM | POA: Diagnosis not present

## 2018-09-07 MED ORDER — DICLOFENAC SODIUM 1 % TD GEL: 2.0000 g | Freq: Four times a day (QID) | TRANSDERMAL | 2 refills | Status: AC

## 2018-09-08 NOTE — Progress Notes (Signed)
Subjective: 85 y.o. returns the office today for painful, elongated, thickened toenails which she cannot trim herself. Denies any redness or drainage around the nails.  She also gets calluses on the right foot are painful.  In general she states that she is had some chronic foot pain for several years and she gets to the ball of her foot mostly.  The diabetic shoes and inserts were helpful at first however she recently fractured her leg and she is had swelling and not been able to wear her compression socks so she cannot wear the diabetic shoes.  Her family members also has noticed any kind of anti-inflammatory cream that we can do to help with the pain to her feet at times.  Denies any acute changes since last appointment and no new complaints today. Denies any systemic complaints such as fevers, chills, nausea, vomiting.   PCP: Lajean Manes, MD   Objective: AAO 3, NAD DP/PT pulses palpable, CRT less than 3 seconds Nails hypertrophic, dystrophic, elongated, brittle, discolored 10. There is tenderness overlying the nails 1-5 bilaterally. There is no surrounding erythema or drainage along the nail sites. Hyperkeratotic lesions of distal aspect of the right second, third, fourth toes.  The right second toe is pre-ulcerative.  Upon debridement there is no ongoing ulceration drainage or signs of infection. Prominent metatarsal heads.  Hammertoes present.  In general the majority of pain she gets to the metatarsal heads plantarly.  No significant tenderness today. No open lesions or pre-ulcerative lesions are identified. No other areas of tenderness bilateral lower extremities. No overlying edema, erythema, increased warmth. No pain with calf compression, swelling, warmth, erythema.  Assessment: Patient presents with symptomatic onychomycosis; pre-ulcerative calluses  Plan: -Treatment options including alternatives, risks, complications were discussed -Nails sharply debrided 10 without  complication/bleeding. -Hyperkeratotic lesion sharply debrided x3 without complications or bleeding -Prescribed Voltaren gel to use as needed -We did modify her inserts that she is wearing diabetic shoes.  She has been out of the office and she states they felt great. -Discussed daily foot inspection. If there are any changes, to call the office immediately.  -Follow-up in 9 weeks or sooner if any problems are to arise. In the meantime, encouraged to call the office with any questions, concerns, changes symptoms.  Celesta Gentile, DPM

## 2018-11-10 IMAGING — RF DG ESOPHAGUS
8 of 10 series · 14 of 24 positions shown · non-contrast
Comparison: None.

CLINICAL DATA: Chronic esophageal dysphagia with globus sensation
in the neck

EXAM:
ESOPHOGRAM / BARIUM SWALLOW / BARIUM TABLET STUDY
TECHNIQUE: Single contrast examination performed using thin barium liquid. The
patient was observed with fluoroscopy swallowing a 13 mm barium
sulphate tablet.
FLUOROSCOPY TIME:  Fluoroscopy Time:  2 minutes 54 seconds
Radiation Exposure Index (if provided by the fluoroscopic device):
75 mGy
Number of Acquired Spot Images: 6

[Series 1: sequence · 2 of 25 frames shown (1 of 7)]
[frame 2/25]
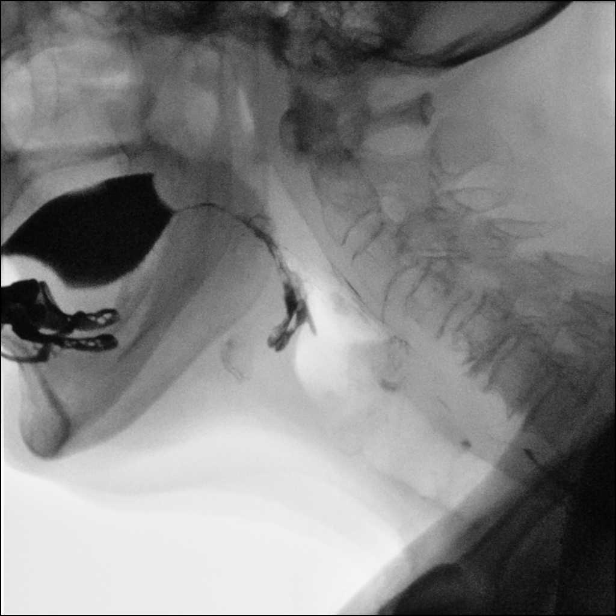
[frame 22/25]
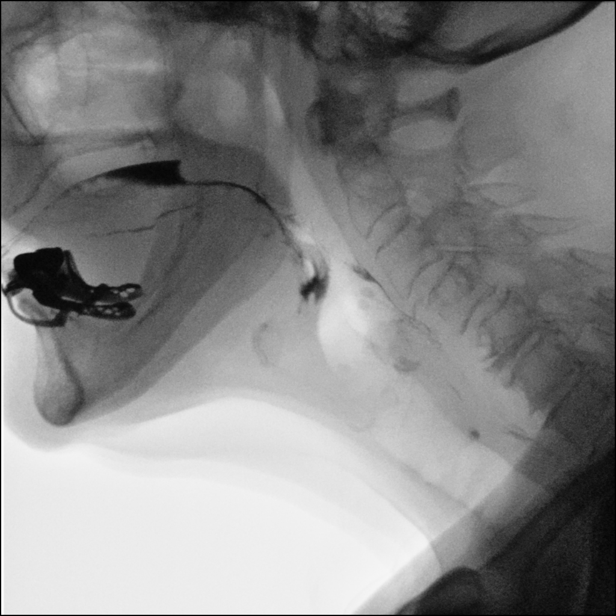

[Series 3: sequence · 1 of 26 frames shown (2 of 7)]
[frame 4/26]
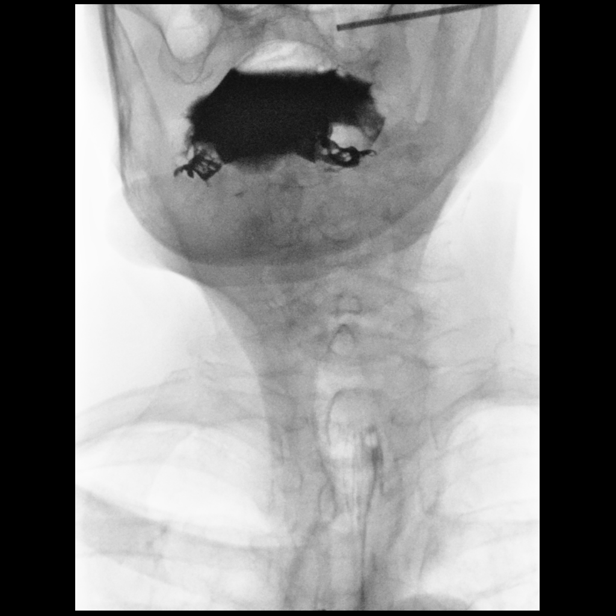

[Series 4: one shot · 0.15mm/px · 3 of 5 slices shown]
[im 1/5]
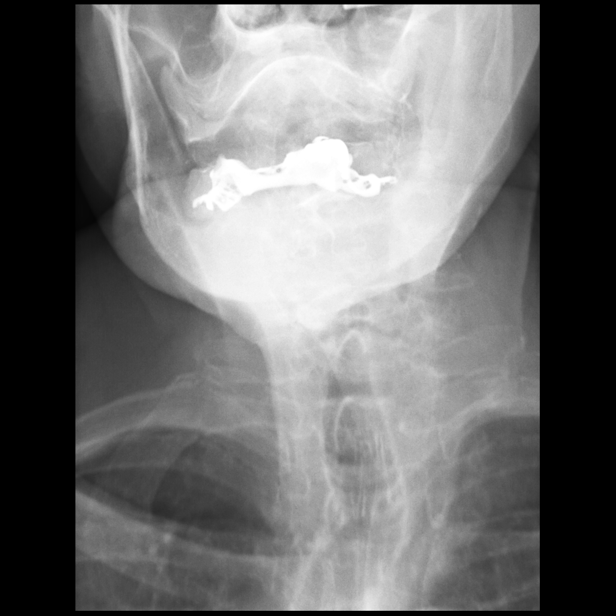
[im 2/5]
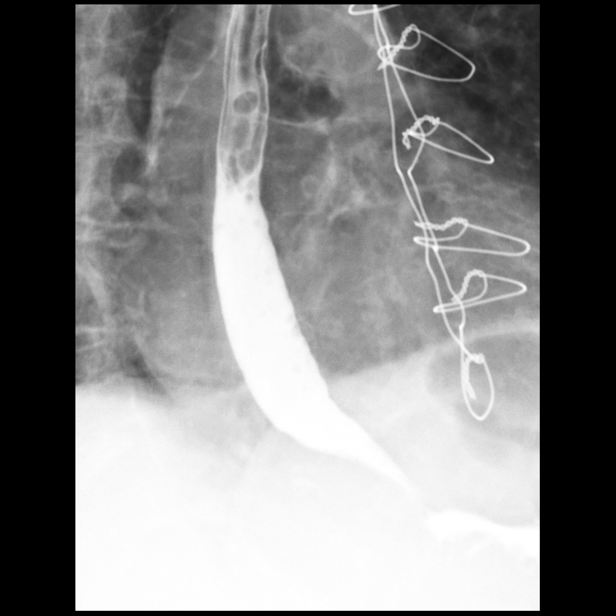
[im 5/5]
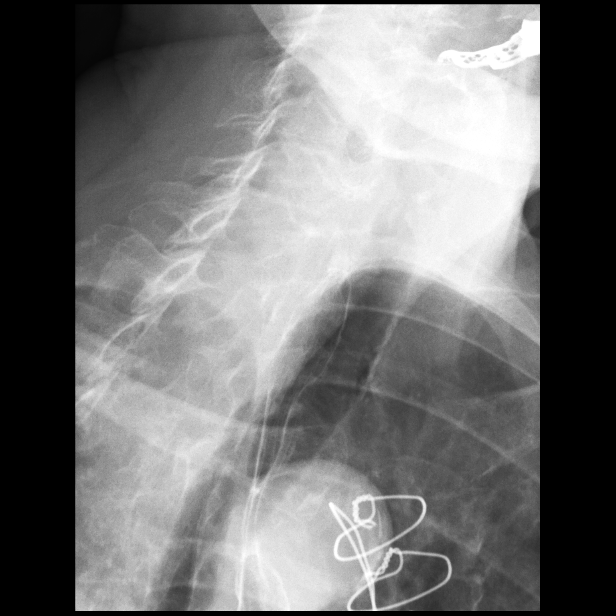

[Series 5: sequence · 1 of 35 frames shown (3 of 7)]
[frame 29/35]
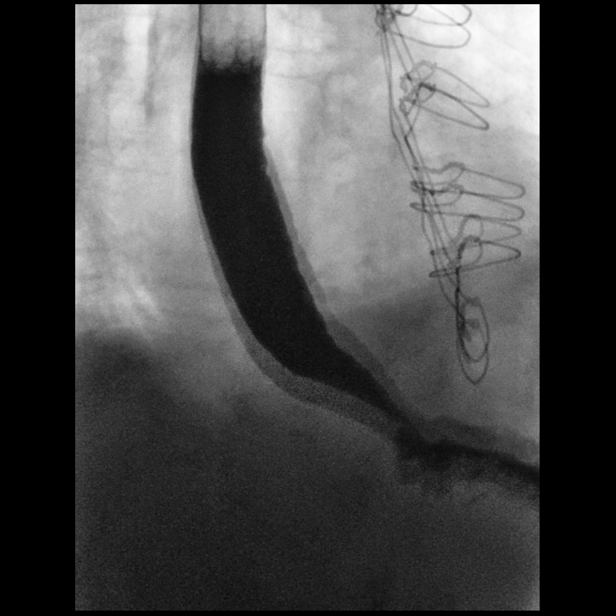

[Series 6: sequence · 2 of 19 frames shown (4 of 7)]
[frame 1/19]
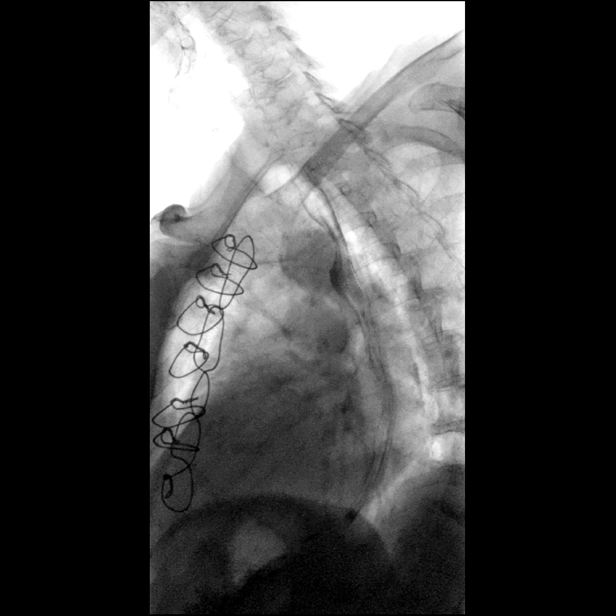
[frame 17/19]
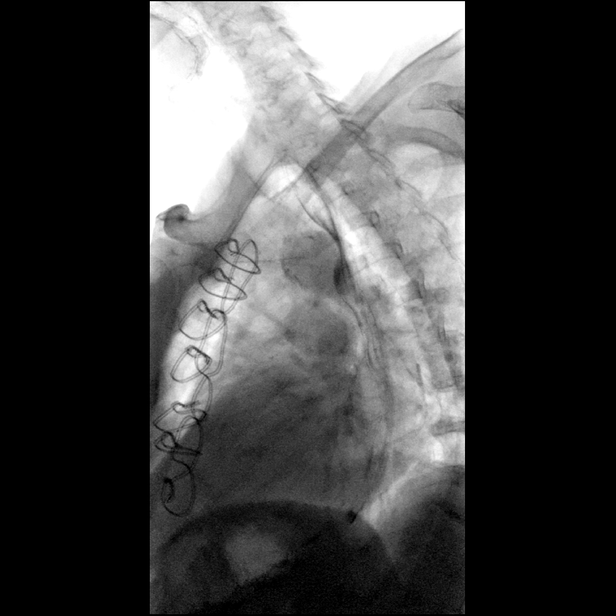

[Series 7: sequence · 1 of 48 frames shown (5 of 7)]
[frame 31/48]
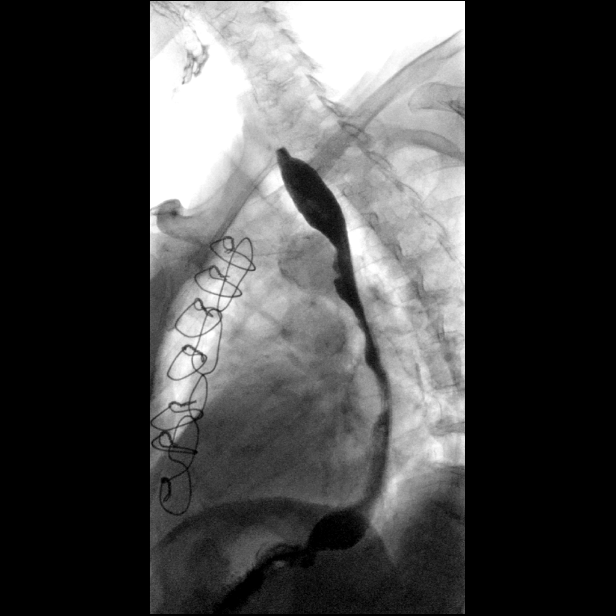

[Series 8: sequence · 2 of 111 frames shown (6 of 7)]
[frame 17/111]
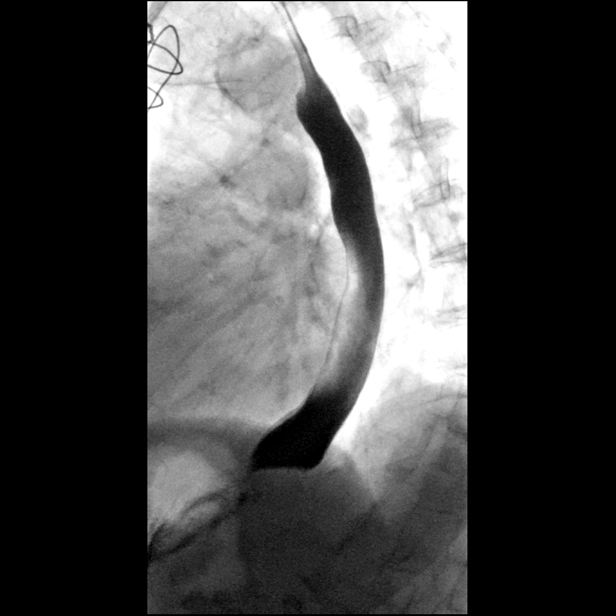
[frame 56/111]
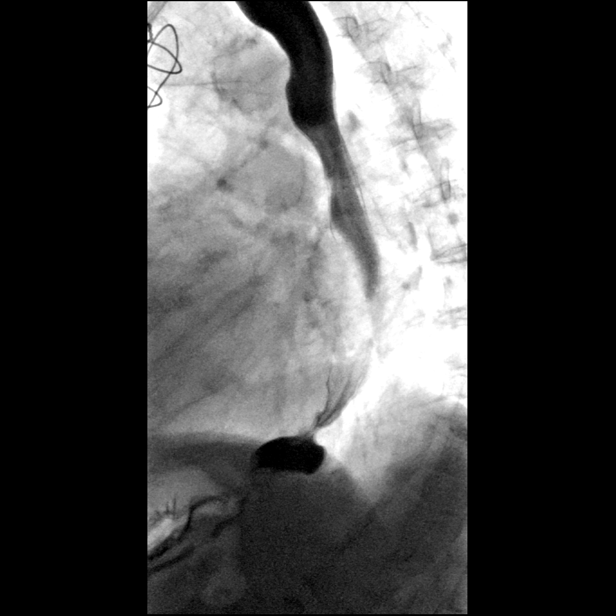

[Series 10: sequence · 2 of 78 frames shown (7 of 7)]
[frame 12/78]
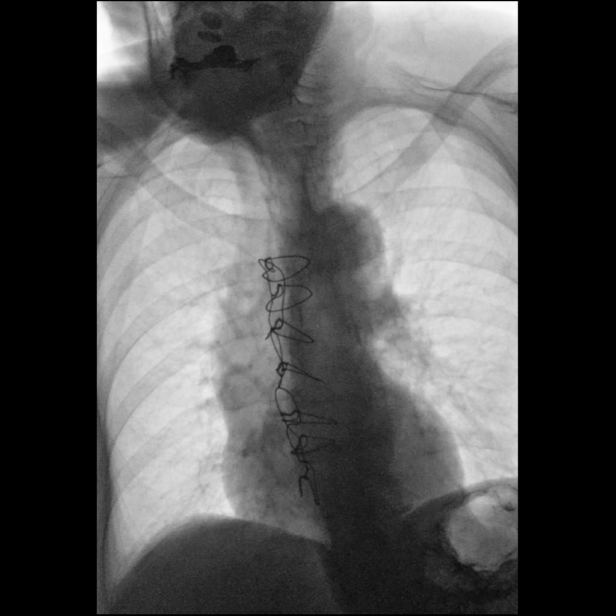
[frame 67/78]
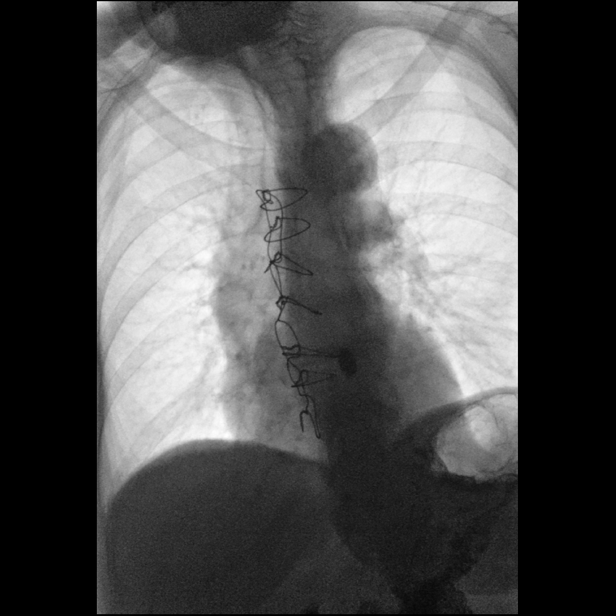

[14 of 24 positions shown; findings below may reference images not displayed]

FINDINGS: The oral and pharyngeal phases of swallowing are nearly normal, with
no laryngeal penetration or tracheobronchial aspiration. Minimal
barium retention in the vallecula. No evidence of pharyngeal mass,
stricture or diverticulum. There is mild cricopharyngeus muscle
dysfunction, characterized by delayed opening of the cricopharyngeus
muscle.

There is mild esophageal dysmotility with proximal escape of the
barium bolus in the upper thoracic esophagus. Normal esophageal
distensibility, with no evidence of esophageal mass, stricture or
ulcer. The barium tablet traversed the esophagus into the stomach
without significant delay. No hiatal hernia. Gastroesophageal reflux
was not elicited despite provocative maneuvers including water
siphon test.
IMPRESSION: 1. Essentially normal oral and pharyngeal phase of swallowing, with
no laryngeal penetration or tracheobronchial aspiration. Minimal
barium retention in the vallecula.
2. Mild cricopharyngeus muscle dysfunction.
3. No hiatal hernia.  Gastroesophageal reflux not elicited.
4. Mild esophageal dysmotility, with presbyesophagus pattern.
5. No evidence of esophageal mass, stricture or ulcer.

## 2019-03-15 ENCOUNTER — Ambulatory Visit: Payer: Medicare Other | Admitting: Podiatry

## 2019-03-31 ENCOUNTER — Other Ambulatory Visit: Payer: Self-pay | Admitting: Geriatric Medicine

## 2019-03-31 DIAGNOSIS — Z1231 Encounter for screening mammogram for malignant neoplasm of breast: Secondary | ICD-10-CM

## 2019-04-05 ENCOUNTER — Ambulatory Visit: Payer: Medicare Other | Attending: Internal Medicine

## 2019-04-05 ENCOUNTER — Other Ambulatory Visit: Payer: Self-pay

## 2019-04-05 DIAGNOSIS — Z23 Encounter for immunization: Secondary | ICD-10-CM

## 2019-04-05 NOTE — Progress Notes (Signed)
   Covid-19 Vaccination Clinic  Name:  MICKEL HAMIDEH    MRN: [id number] DOB: May 05, 1927  04/05/2019  Ms. Reum was observed post Covid-19 immunization for 15 minutes without incidence. She was provided with Vaccine Information Sheet and instruction to access the V-Safe system.   Ms. Lewison was instructed to call 911 with any severe reactions post vaccine: Marland Kitchen Difficulty breathing  . Swelling of your face and throat  . A fast heartbeat  . A bad rash all over your body  . Dizziness and weakness    Immunizations Administered    Name Date Dose VIS Date Route   Moderna COVID-19 Vaccine 04/05/2019 11:43 AM 0.5 mL 01/19/2019 Intramuscular   Manufacturer: Moderna   Lot: [id number]   [id number]

## 2019-05-04 ENCOUNTER — Other Ambulatory Visit: Payer: Self-pay

## 2019-05-04 ENCOUNTER — Ambulatory Visit: Payer: Medicare Other | Attending: Internal Medicine

## 2019-05-04 DIAGNOSIS — Z23 Encounter for immunization: Secondary | ICD-10-CM

## 2019-05-04 NOTE — Progress Notes (Signed)
   Covid-19 Vaccination Clinic  Name:  ANNELISE BLIZARD    MRN: [id number] DOB: 18-May-1927  05/04/2019  Ms. Cacchione was observed post Covid-19 immunization for 15 minutes without incident. She was provided with Vaccine Information Sheet and instruction to access the V-Safe system.   Ms. Novicki was instructed to call 911 with any severe reactions post vaccine: Marland Kitchen Difficulty breathing  . Swelling of face and throat  . A fast heartbeat  . A bad rash all over body  . Dizziness and weakness   Immunizations Administered    Name Date Dose VIS Date Route   Moderna COVID-19 Vaccine 05/04/2019 11:37 AM 0.5 mL 01/19/2019 Intramuscular   Manufacturer: Moderna   Lot: [id number]   [id number]

## 2019-05-07 ENCOUNTER — Ambulatory Visit
Admission: RE | Admit: 2019-05-07 | Discharge: 2019-05-07 | Disposition: A | Discharge status: Home / Self Care | Payer: Medicare Other | Source: Ambulatory Visit | Attending: Geriatric Medicine | Admitting: Geriatric Medicine

## 2019-05-07 ENCOUNTER — Other Ambulatory Visit: Payer: Self-pay

## 2019-05-07 DIAGNOSIS — Z1231 Encounter for screening mammogram for malignant neoplasm of breast: Secondary | ICD-10-CM

## 2019-07-23 ENCOUNTER — Ambulatory Visit
Admission: RE | Admit: 2019-07-23 | Discharge: 2019-07-23 | Disposition: A | Discharge status: Home / Self Care | Payer: Medicare Other | Source: Ambulatory Visit | Attending: Internal Medicine | Admitting: Internal Medicine

## 2019-07-23 ENCOUNTER — Other Ambulatory Visit: Payer: Self-pay | Admitting: Internal Medicine

## 2019-07-23 DIAGNOSIS — R52 Pain, unspecified: Secondary | ICD-10-CM

## 2019-07-26 ENCOUNTER — Other Ambulatory Visit (HOSPITAL_COMMUNITY): Payer: Self-pay | Admitting: Internal Medicine

## 2019-07-26 DIAGNOSIS — M7989 Other specified soft tissue disorders: Secondary | ICD-10-CM

## 2019-07-26 DIAGNOSIS — M79604 Pain in right leg: Secondary | ICD-10-CM

## 2019-07-28 ENCOUNTER — Other Ambulatory Visit: Payer: Self-pay

## 2019-07-28 ENCOUNTER — Ambulatory Visit (HOSPITAL_COMMUNITY)
Admission: RE | Admit: 2019-07-28 | Discharge: 2019-07-28 | Disposition: A | Discharge status: Home / Self Care | Payer: Medicare Other | Source: Ambulatory Visit | Attending: Internal Medicine | Admitting: Internal Medicine

## 2019-07-28 DIAGNOSIS — M7989 Other specified soft tissue disorders: Secondary | ICD-10-CM

## 2019-07-28 DIAGNOSIS — M79604 Pain in right leg: Secondary | ICD-10-CM | POA: Diagnosis present

## 2020-03-13 DIAGNOSIS — I10 Essential (primary) hypertension: Secondary | ICD-10-CM | POA: Diagnosis not present

## 2020-03-13 DIAGNOSIS — E7800 Pure hypercholesterolemia, unspecified: Secondary | ICD-10-CM | POA: Diagnosis not present

## 2020-03-13 DIAGNOSIS — K219 Gastro-esophageal reflux disease without esophagitis: Secondary | ICD-10-CM | POA: Diagnosis not present

## 2020-03-31 DIAGNOSIS — I10 Essential (primary) hypertension: Secondary | ICD-10-CM | POA: Diagnosis not present

## 2020-03-31 DIAGNOSIS — I872 Venous insufficiency (chronic) (peripheral): Secondary | ICD-10-CM | POA: Diagnosis not present

## 2020-04-12 DIAGNOSIS — E7800 Pure hypercholesterolemia, unspecified: Secondary | ICD-10-CM | POA: Diagnosis not present

## 2020-04-12 DIAGNOSIS — K219 Gastro-esophageal reflux disease without esophagitis: Secondary | ICD-10-CM | POA: Diagnosis not present

## 2020-04-12 DIAGNOSIS — I10 Essential (primary) hypertension: Secondary | ICD-10-CM | POA: Diagnosis not present

## 2020-05-10 ENCOUNTER — Other Ambulatory Visit: Payer: Self-pay | Admitting: Geriatric Medicine

## 2020-05-10 DIAGNOSIS — Z1231 Encounter for screening mammogram for malignant neoplasm of breast: Secondary | ICD-10-CM

## 2020-05-16 DIAGNOSIS — I10 Essential (primary) hypertension: Secondary | ICD-10-CM | POA: Diagnosis not present

## 2020-05-16 DIAGNOSIS — K219 Gastro-esophageal reflux disease without esophagitis: Secondary | ICD-10-CM | POA: Diagnosis not present

## 2020-05-16 DIAGNOSIS — E7800 Pure hypercholesterolemia, unspecified: Secondary | ICD-10-CM | POA: Diagnosis not present

## 2020-05-27 DIAGNOSIS — I10 Essential (primary) hypertension: Secondary | ICD-10-CM | POA: Diagnosis not present

## 2020-05-27 DIAGNOSIS — E7800 Pure hypercholesterolemia, unspecified: Secondary | ICD-10-CM | POA: Diagnosis not present

## 2020-05-27 DIAGNOSIS — K219 Gastro-esophageal reflux disease without esophagitis: Secondary | ICD-10-CM | POA: Diagnosis not present

## 2020-07-03 ENCOUNTER — Other Ambulatory Visit: Payer: Self-pay

## 2020-07-03 ENCOUNTER — Ambulatory Visit
Admission: RE | Admit: 2020-07-03 | Discharge: 2020-07-03 | Disposition: A | Discharge status: Home / Self Care | Payer: Medicare Other | Source: Ambulatory Visit | Attending: Geriatric Medicine | Admitting: Geriatric Medicine

## 2020-07-03 DIAGNOSIS — Z1231 Encounter for screening mammogram for malignant neoplasm of breast: Secondary | ICD-10-CM

## 2020-07-11 DIAGNOSIS — E7800 Pure hypercholesterolemia, unspecified: Secondary | ICD-10-CM | POA: Diagnosis not present

## 2020-07-11 DIAGNOSIS — K219 Gastro-esophageal reflux disease without esophagitis: Secondary | ICD-10-CM | POA: Diagnosis not present

## 2020-07-11 DIAGNOSIS — I10 Essential (primary) hypertension: Secondary | ICD-10-CM | POA: Diagnosis not present

## 2020-08-02 ENCOUNTER — Other Ambulatory Visit: Payer: Self-pay | Admitting: Geriatric Medicine

## 2020-08-02 DIAGNOSIS — M79661 Pain in right lower leg: Secondary | ICD-10-CM | POA: Diagnosis not present

## 2020-08-02 DIAGNOSIS — K219 Gastro-esophageal reflux disease without esophagitis: Secondary | ICD-10-CM | POA: Diagnosis not present

## 2020-08-02 DIAGNOSIS — Z79899 Other long term (current) drug therapy: Secondary | ICD-10-CM | POA: Diagnosis not present

## 2020-08-02 DIAGNOSIS — R413 Other amnesia: Secondary | ICD-10-CM | POA: Diagnosis not present

## 2020-08-02 DIAGNOSIS — I10 Essential (primary) hypertension: Secondary | ICD-10-CM | POA: Diagnosis not present

## 2020-08-02 DIAGNOSIS — E7800 Pure hypercholesterolemia, unspecified: Secondary | ICD-10-CM | POA: Diagnosis not present

## 2020-08-02 DIAGNOSIS — Z0000 Encounter for general adult medical examination without abnormal findings: Secondary | ICD-10-CM | POA: Diagnosis not present

## 2020-08-02 DIAGNOSIS — Z1389 Encounter for screening for other disorder: Secondary | ICD-10-CM | POA: Diagnosis not present

## 2020-08-03 ENCOUNTER — Ambulatory Visit
Admission: RE | Admit: 2020-08-03 | Discharge: 2020-08-03 | Disposition: A | Discharge status: Home / Self Care | Payer: Medicare Other | Source: Ambulatory Visit | Attending: Geriatric Medicine | Admitting: Geriatric Medicine

## 2020-08-03 DIAGNOSIS — M79661 Pain in right lower leg: Secondary | ICD-10-CM | POA: Diagnosis not present

## 2020-08-03 DIAGNOSIS — I808 Phlebitis and thrombophlebitis of other sites: Secondary | ICD-10-CM | POA: Diagnosis not present

## 2020-08-03 DIAGNOSIS — I8391 Asymptomatic varicose veins of right lower extremity: Secondary | ICD-10-CM | POA: Diagnosis not present

## 2020-08-03 DIAGNOSIS — I8001 Phlebitis and thrombophlebitis of superficial vessels of right lower extremity: Secondary | ICD-10-CM | POA: Diagnosis not present

## 2020-08-11 DIAGNOSIS — E7800 Pure hypercholesterolemia, unspecified: Secondary | ICD-10-CM | POA: Diagnosis not present

## 2020-08-11 DIAGNOSIS — K219 Gastro-esophageal reflux disease without esophagitis: Secondary | ICD-10-CM | POA: Diagnosis not present

## 2020-08-11 DIAGNOSIS — I10 Essential (primary) hypertension: Secondary | ICD-10-CM | POA: Diagnosis not present

## 2020-09-28 DIAGNOSIS — H9202 Otalgia, left ear: Secondary | ICD-10-CM | POA: Diagnosis not present

## 2020-09-28 DIAGNOSIS — H6121 Impacted cerumen, right ear: Secondary | ICD-10-CM | POA: Diagnosis not present

## 2020-10-16 DIAGNOSIS — R609 Edema, unspecified: Secondary | ICD-10-CM | POA: Diagnosis not present

## 2020-10-16 DIAGNOSIS — Z85038 Personal history of other malignant neoplasm of large intestine: Secondary | ICD-10-CM | POA: Diagnosis not present

## 2020-10-16 DIAGNOSIS — I2510 Atherosclerotic heart disease of native coronary artery without angina pectoris: Secondary | ICD-10-CM | POA: Diagnosis not present

## 2020-10-16 DIAGNOSIS — I10 Essential (primary) hypertension: Secondary | ICD-10-CM | POA: Diagnosis not present

## 2020-10-16 DIAGNOSIS — R600 Localized edema: Secondary | ICD-10-CM | POA: Insufficient documentation

## 2020-10-16 DIAGNOSIS — F411 Generalized anxiety disorder: Secondary | ICD-10-CM | POA: Insufficient documentation

## 2020-10-16 DIAGNOSIS — E785 Hyperlipidemia, unspecified: Secondary | ICD-10-CM | POA: Diagnosis not present

## 2020-10-16 DIAGNOSIS — Z9181 History of falling: Secondary | ICD-10-CM | POA: Diagnosis not present

## 2020-10-16 DIAGNOSIS — H9190 Unspecified hearing loss, unspecified ear: Secondary | ICD-10-CM | POA: Diagnosis not present

## 2020-10-18 DIAGNOSIS — I10 Essential (primary) hypertension: Secondary | ICD-10-CM | POA: Diagnosis not present

## 2020-10-18 DIAGNOSIS — M6281 Muscle weakness (generalized): Secondary | ICD-10-CM | POA: Diagnosis not present

## 2020-10-19 DIAGNOSIS — M6281 Muscle weakness (generalized): Secondary | ICD-10-CM | POA: Diagnosis not present

## 2020-10-19 DIAGNOSIS — I10 Essential (primary) hypertension: Secondary | ICD-10-CM | POA: Diagnosis not present

## 2020-10-24 DIAGNOSIS — M6281 Muscle weakness (generalized): Secondary | ICD-10-CM | POA: Diagnosis not present

## 2020-10-24 DIAGNOSIS — I10 Essential (primary) hypertension: Secondary | ICD-10-CM | POA: Diagnosis not present

## 2020-10-24 DIAGNOSIS — H903 Sensorineural hearing loss, bilateral: Secondary | ICD-10-CM | POA: Diagnosis not present

## 2020-10-26 DIAGNOSIS — I10 Essential (primary) hypertension: Secondary | ICD-10-CM | POA: Diagnosis not present

## 2020-10-26 DIAGNOSIS — M6281 Muscle weakness (generalized): Secondary | ICD-10-CM | POA: Diagnosis not present

## 2020-10-31 DIAGNOSIS — I10 Essential (primary) hypertension: Secondary | ICD-10-CM | POA: Diagnosis not present

## 2020-10-31 DIAGNOSIS — M6281 Muscle weakness (generalized): Secondary | ICD-10-CM | POA: Diagnosis not present

## 2020-11-01 DIAGNOSIS — I10 Essential (primary) hypertension: Secondary | ICD-10-CM | POA: Diagnosis not present

## 2020-11-01 DIAGNOSIS — M6281 Muscle weakness (generalized): Secondary | ICD-10-CM | POA: Diagnosis not present

## 2020-11-02 DIAGNOSIS — M6281 Muscle weakness (generalized): Secondary | ICD-10-CM | POA: Diagnosis not present

## 2020-11-02 DIAGNOSIS — I10 Essential (primary) hypertension: Secondary | ICD-10-CM | POA: Diagnosis not present

## 2020-11-07 DIAGNOSIS — M6281 Muscle weakness (generalized): Secondary | ICD-10-CM | POA: Diagnosis not present

## 2020-11-07 DIAGNOSIS — I10 Essential (primary) hypertension: Secondary | ICD-10-CM | POA: Diagnosis not present

## 2020-11-09 DIAGNOSIS — M6281 Muscle weakness (generalized): Secondary | ICD-10-CM | POA: Diagnosis not present

## 2020-11-09 DIAGNOSIS — I10 Essential (primary) hypertension: Secondary | ICD-10-CM | POA: Diagnosis not present

## 2020-11-13 DIAGNOSIS — E785 Hyperlipidemia, unspecified: Secondary | ICD-10-CM | POA: Diagnosis not present

## 2020-11-13 DIAGNOSIS — I2510 Atherosclerotic heart disease of native coronary artery without angina pectoris: Secondary | ICD-10-CM | POA: Diagnosis not present

## 2020-11-13 DIAGNOSIS — E876 Hypokalemia: Secondary | ICD-10-CM | POA: Diagnosis not present

## 2020-11-13 DIAGNOSIS — Z951 Presence of aortocoronary bypass graft: Secondary | ICD-10-CM | POA: Diagnosis not present

## 2020-11-13 DIAGNOSIS — I10 Essential (primary) hypertension: Secondary | ICD-10-CM | POA: Diagnosis not present

## 2020-11-13 DIAGNOSIS — H9190 Unspecified hearing loss, unspecified ear: Secondary | ICD-10-CM | POA: Diagnosis not present

## 2020-11-13 DIAGNOSIS — Z9181 History of falling: Secondary | ICD-10-CM | POA: Diagnosis not present

## 2020-11-13 DIAGNOSIS — I878 Other specified disorders of veins: Secondary | ICD-10-CM | POA: Diagnosis not present

## 2020-11-15 DIAGNOSIS — Z951 Presence of aortocoronary bypass graft: Secondary | ICD-10-CM | POA: Diagnosis not present

## 2020-11-15 DIAGNOSIS — I10 Essential (primary) hypertension: Secondary | ICD-10-CM | POA: Diagnosis not present

## 2020-11-15 DIAGNOSIS — Z9181 History of falling: Secondary | ICD-10-CM | POA: Diagnosis not present

## 2020-11-15 DIAGNOSIS — E876 Hypokalemia: Secondary | ICD-10-CM | POA: Diagnosis not present

## 2020-11-15 DIAGNOSIS — E785 Hyperlipidemia, unspecified: Secondary | ICD-10-CM | POA: Diagnosis not present

## 2020-11-15 DIAGNOSIS — I2510 Atherosclerotic heart disease of native coronary artery without angina pectoris: Secondary | ICD-10-CM | POA: Diagnosis not present

## 2020-11-15 DIAGNOSIS — I878 Other specified disorders of veins: Secondary | ICD-10-CM | POA: Diagnosis not present

## 2020-11-15 DIAGNOSIS — H9190 Unspecified hearing loss, unspecified ear: Secondary | ICD-10-CM | POA: Diagnosis not present

## 2020-11-16 DIAGNOSIS — I2510 Atherosclerotic heart disease of native coronary artery without angina pectoris: Secondary | ICD-10-CM | POA: Diagnosis not present

## 2020-11-16 DIAGNOSIS — E785 Hyperlipidemia, unspecified: Secondary | ICD-10-CM | POA: Diagnosis not present

## 2020-11-16 DIAGNOSIS — E876 Hypokalemia: Secondary | ICD-10-CM | POA: Diagnosis not present

## 2020-11-16 DIAGNOSIS — Z9181 History of falling: Secondary | ICD-10-CM | POA: Diagnosis not present

## 2020-11-16 DIAGNOSIS — H9190 Unspecified hearing loss, unspecified ear: Secondary | ICD-10-CM | POA: Diagnosis not present

## 2020-11-16 DIAGNOSIS — I10 Essential (primary) hypertension: Secondary | ICD-10-CM | POA: Diagnosis not present

## 2020-11-16 DIAGNOSIS — I878 Other specified disorders of veins: Secondary | ICD-10-CM | POA: Diagnosis not present

## 2020-11-16 DIAGNOSIS — Z951 Presence of aortocoronary bypass graft: Secondary | ICD-10-CM | POA: Diagnosis not present

## 2020-11-23 DIAGNOSIS — I878 Other specified disorders of veins: Secondary | ICD-10-CM | POA: Diagnosis not present

## 2020-11-23 DIAGNOSIS — I2510 Atherosclerotic heart disease of native coronary artery without angina pectoris: Secondary | ICD-10-CM | POA: Diagnosis not present

## 2020-11-23 DIAGNOSIS — E876 Hypokalemia: Secondary | ICD-10-CM | POA: Diagnosis not present

## 2020-11-23 DIAGNOSIS — Z951 Presence of aortocoronary bypass graft: Secondary | ICD-10-CM | POA: Diagnosis not present

## 2020-11-23 DIAGNOSIS — Z9181 History of falling: Secondary | ICD-10-CM | POA: Diagnosis not present

## 2020-11-23 DIAGNOSIS — H9190 Unspecified hearing loss, unspecified ear: Secondary | ICD-10-CM | POA: Diagnosis not present

## 2020-11-23 DIAGNOSIS — E785 Hyperlipidemia, unspecified: Secondary | ICD-10-CM | POA: Diagnosis not present

## 2020-11-23 DIAGNOSIS — I10 Essential (primary) hypertension: Secondary | ICD-10-CM | POA: Diagnosis not present

## 2020-11-28 DIAGNOSIS — I10 Essential (primary) hypertension: Secondary | ICD-10-CM | POA: Diagnosis not present

## 2020-11-28 DIAGNOSIS — I878 Other specified disorders of veins: Secondary | ICD-10-CM | POA: Diagnosis not present

## 2020-11-28 DIAGNOSIS — H9190 Unspecified hearing loss, unspecified ear: Secondary | ICD-10-CM | POA: Diagnosis not present

## 2020-11-28 DIAGNOSIS — Z951 Presence of aortocoronary bypass graft: Secondary | ICD-10-CM | POA: Diagnosis not present

## 2020-11-28 DIAGNOSIS — E785 Hyperlipidemia, unspecified: Secondary | ICD-10-CM | POA: Diagnosis not present

## 2020-11-28 DIAGNOSIS — Z9181 History of falling: Secondary | ICD-10-CM | POA: Diagnosis not present

## 2020-11-28 DIAGNOSIS — I2510 Atherosclerotic heart disease of native coronary artery without angina pectoris: Secondary | ICD-10-CM | POA: Diagnosis not present

## 2020-11-28 DIAGNOSIS — E876 Hypokalemia: Secondary | ICD-10-CM | POA: Diagnosis not present

## 2021-01-18 DIAGNOSIS — R413 Other amnesia: Secondary | ICD-10-CM | POA: Insufficient documentation

## 2021-01-18 DIAGNOSIS — K219 Gastro-esophageal reflux disease without esophagitis: Secondary | ICD-10-CM | POA: Insufficient documentation

## 2021-01-18 DIAGNOSIS — F5101 Primary insomnia: Secondary | ICD-10-CM | POA: Insufficient documentation

## 2021-01-18 DIAGNOSIS — I10 Essential (primary) hypertension: Secondary | ICD-10-CM | POA: Diagnosis not present

## 2021-01-18 DIAGNOSIS — R269 Unspecified abnormalities of gait and mobility: Secondary | ICD-10-CM | POA: Insufficient documentation

## 2021-01-18 DIAGNOSIS — Z0000 Encounter for general adult medical examination without abnormal findings: Secondary | ICD-10-CM | POA: Diagnosis not present

## 2021-01-18 DIAGNOSIS — F09 Unspecified mental disorder due to known physiological condition: Secondary | ICD-10-CM | POA: Insufficient documentation

## 2021-01-18 DIAGNOSIS — E7800 Pure hypercholesterolemia, unspecified: Secondary | ICD-10-CM | POA: Insufficient documentation

## 2021-01-18 DIAGNOSIS — R443 Hallucinations, unspecified: Secondary | ICD-10-CM | POA: Insufficient documentation

## 2021-01-29 DIAGNOSIS — E876 Hypokalemia: Secondary | ICD-10-CM | POA: Diagnosis not present

## 2021-01-29 DIAGNOSIS — I10 Essential (primary) hypertension: Secondary | ICD-10-CM | POA: Diagnosis not present

## 2021-01-29 DIAGNOSIS — R601 Generalized edema: Secondary | ICD-10-CM | POA: Diagnosis not present

## 2021-01-29 DIAGNOSIS — L299 Pruritus, unspecified: Secondary | ICD-10-CM | POA: Diagnosis not present

## 2021-01-29 DIAGNOSIS — L989 Disorder of the skin and subcutaneous tissue, unspecified: Secondary | ICD-10-CM | POA: Diagnosis not present

## 2021-01-29 DIAGNOSIS — R609 Edema, unspecified: Secondary | ICD-10-CM | POA: Diagnosis not present

## 2021-02-06 DIAGNOSIS — R609 Edema, unspecified: Secondary | ICD-10-CM | POA: Diagnosis not present

## 2021-02-06 DIAGNOSIS — R600 Localized edema: Secondary | ICD-10-CM | POA: Diagnosis not present

## 2021-02-09 DIAGNOSIS — R609 Edema, unspecified: Secondary | ICD-10-CM | POA: Diagnosis not present

## 2021-02-09 DIAGNOSIS — I2510 Atherosclerotic heart disease of native coronary artery without angina pectoris: Secondary | ICD-10-CM | POA: Diagnosis not present

## 2021-02-19 DIAGNOSIS — C44319 Basal cell carcinoma of skin of other parts of face: Secondary | ICD-10-CM | POA: Diagnosis not present

## 2021-02-19 DIAGNOSIS — L300 Nummular dermatitis: Secondary | ICD-10-CM | POA: Diagnosis not present

## 2021-02-22 DIAGNOSIS — I10 Essential (primary) hypertension: Secondary | ICD-10-CM | POA: Diagnosis not present

## 2021-02-22 DIAGNOSIS — R600 Localized edema: Secondary | ICD-10-CM | POA: Diagnosis not present

## 2021-02-22 DIAGNOSIS — I351 Nonrheumatic aortic (valve) insufficiency: Secondary | ICD-10-CM | POA: Diagnosis not present

## 2021-02-22 DIAGNOSIS — E876 Hypokalemia: Secondary | ICD-10-CM | POA: Diagnosis not present

## 2021-02-22 DIAGNOSIS — I2510 Atherosclerotic heart disease of native coronary artery without angina pectoris: Secondary | ICD-10-CM | POA: Diagnosis not present

## 2021-03-12 DIAGNOSIS — C44319 Basal cell carcinoma of skin of other parts of face: Secondary | ICD-10-CM | POA: Diagnosis not present

## 2021-03-21 DIAGNOSIS — L03116 Cellulitis of left lower limb: Secondary | ICD-10-CM | POA: Diagnosis not present

## 2021-04-19 DIAGNOSIS — E876 Hypokalemia: Secondary | ICD-10-CM | POA: Diagnosis not present

## 2021-04-19 DIAGNOSIS — I2510 Atherosclerotic heart disease of native coronary artery without angina pectoris: Secondary | ICD-10-CM | POA: Diagnosis not present

## 2021-04-19 DIAGNOSIS — R600 Localized edema: Secondary | ICD-10-CM | POA: Diagnosis not present

## 2021-04-19 DIAGNOSIS — I10 Essential (primary) hypertension: Secondary | ICD-10-CM | POA: Diagnosis not present

## 2021-04-30 DIAGNOSIS — S0083XA Contusion of other part of head, initial encounter: Secondary | ICD-10-CM | POA: Diagnosis not present

## 2021-04-30 DIAGNOSIS — S3992XA Unspecified injury of lower back, initial encounter: Secondary | ICD-10-CM | POA: Diagnosis not present

## 2021-04-30 DIAGNOSIS — Z951 Presence of aortocoronary bypass graft: Secondary | ICD-10-CM | POA: Diagnosis not present

## 2021-04-30 DIAGNOSIS — M545 Low back pain, unspecified: Secondary | ICD-10-CM | POA: Diagnosis not present

## 2021-04-30 DIAGNOSIS — M25552 Pain in left hip: Secondary | ICD-10-CM | POA: Diagnosis not present

## 2021-04-30 DIAGNOSIS — I10 Essential (primary) hypertension: Secondary | ICD-10-CM | POA: Diagnosis not present

## 2021-04-30 DIAGNOSIS — Z79899 Other long term (current) drug therapy: Secondary | ICD-10-CM | POA: Diagnosis not present

## 2021-04-30 DIAGNOSIS — M549 Dorsalgia, unspecified: Secondary | ICD-10-CM | POA: Diagnosis not present

## 2021-04-30 DIAGNOSIS — W19XXXA Unspecified fall, initial encounter: Secondary | ICD-10-CM | POA: Diagnosis not present

## 2021-04-30 DIAGNOSIS — Z043 Encounter for examination and observation following other accident: Secondary | ICD-10-CM | POA: Diagnosis not present

## 2021-04-30 DIAGNOSIS — Z743 Need for continuous supervision: Secondary | ICD-10-CM | POA: Diagnosis not present

## 2021-04-30 DIAGNOSIS — S0990XA Unspecified injury of head, initial encounter: Secondary | ICD-10-CM | POA: Diagnosis not present

## 2021-04-30 DIAGNOSIS — Z886 Allergy status to analgesic agent status: Secondary | ICD-10-CM | POA: Diagnosis not present

## 2021-04-30 DIAGNOSIS — R6889 Other general symptoms and signs: Secondary | ICD-10-CM | POA: Diagnosis not present

## 2021-04-30 DIAGNOSIS — Z885 Allergy status to narcotic agent status: Secondary | ICD-10-CM | POA: Diagnosis not present

## 2021-04-30 DIAGNOSIS — R531 Weakness: Secondary | ICD-10-CM | POA: Diagnosis not present

## 2021-04-30 DIAGNOSIS — Z7982 Long term (current) use of aspirin: Secondary | ICD-10-CM | POA: Diagnosis not present

## 2021-05-29 DIAGNOSIS — I890 Lymphedema, not elsewhere classified: Secondary | ICD-10-CM | POA: Diagnosis not present

## 2021-05-31 ENCOUNTER — Ambulatory Visit: Payer: Medicare Other | Admitting: Podiatry

## 2021-05-31 ENCOUNTER — Encounter: Payer: Self-pay | Admitting: Podiatry

## 2021-05-31 DIAGNOSIS — M79675 Pain in left toe(s): Secondary | ICD-10-CM

## 2021-05-31 DIAGNOSIS — B351 Tinea unguium: Secondary | ICD-10-CM

## 2021-05-31 DIAGNOSIS — L84 Corns and callosities: Secondary | ICD-10-CM | POA: Diagnosis not present

## 2021-05-31 DIAGNOSIS — M79674 Pain in right toe(s): Secondary | ICD-10-CM

## 2021-05-31 NOTE — Progress Notes (Signed)
Subjective: ?88 y.o. returns the office today for painful, elongated, thickened toenails which she cannot trim herself. Denies any redness or drainage around the nails. Relates she also has some calluses that she would like trimmed.  She has had some issues with lymphedema and is being follow-up in lymphedema clinic.  Denies any acute changes since last appointment and no new complaints today. Denies any systemic complaints such as fevers, chills, nausea, vomiting.  ? ?PCP: Lajean Manes, MD ? ? ?Objective: ?AAO ?3, NAD ?DP/PT pulses palpable, CRT less than 3 seconds ?Nails hypertrophic, dystrophic, elongated, brittle, discolored ?10. There is tenderness overlying the nails 1-5 bilaterally. There is no surrounding erythema or drainage along the nail sites. ?Hyperkeratotic lesions of distal aspect of the right second, third, fourth toes.  The right second toe is pre-ulcerative.  Upon debridement there is no ongoing ulceration drainage or signs of infection. ?Prominent metatarsal heads.  Hammertoes present.  In general the majority of pain she gets to the metatarsal heads plantarly.  No significant tenderness today. ?No open lesions or pre-ulcerative lesions are identified. ?No other areas of tenderness bilateral lower extremities. No overlying edema, erythema, increased warmth. ?No pain with calf compression, swelling, warmth, erythema. ? ?Assessment: ?Patient presents with symptomatic onychomycosis; pre-ulcerative calluses ? ?Plan: ?-Treatment options including alternatives, risks, complications were discussed ?-Nails sharply debrided ?10 without complication/bleeding. ?-Hyperkeratotic lesion sharply debrided x3 without complications or bleeding ?-Discussed daily foot inspection. If there are any changes, to call the office immediately.  ?-Follow-up in as needed.  ? ?Lorenda Peck, DPM ? ?

## 2021-06-04 ENCOUNTER — Other Ambulatory Visit: Payer: Self-pay | Admitting: Family Medicine

## 2021-06-04 DIAGNOSIS — Z1231 Encounter for screening mammogram for malignant neoplasm of breast: Secondary | ICD-10-CM

## 2021-06-27 DIAGNOSIS — I890 Lymphedema, not elsewhere classified: Secondary | ICD-10-CM | POA: Diagnosis not present

## 2021-06-28 DIAGNOSIS — L821 Other seborrheic keratosis: Secondary | ICD-10-CM | POA: Diagnosis not present

## 2021-06-28 DIAGNOSIS — C44319 Basal cell carcinoma of skin of other parts of face: Secondary | ICD-10-CM | POA: Diagnosis not present

## 2021-07-04 ENCOUNTER — Ambulatory Visit: Payer: Medicare Other

## 2021-07-12 ENCOUNTER — Ambulatory Visit
Admission: RE | Admit: 2021-07-12 | Discharge: 2021-07-12 | Disposition: A | Discharge status: Home / Self Care | Payer: Medicare Other | Source: Ambulatory Visit | Attending: Family Medicine | Admitting: Family Medicine

## 2021-07-12 DIAGNOSIS — Z1231 Encounter for screening mammogram for malignant neoplasm of breast: Secondary | ICD-10-CM

## 2021-07-20 DIAGNOSIS — Z23 Encounter for immunization: Secondary | ICD-10-CM | POA: Diagnosis not present

## 2021-07-20 DIAGNOSIS — R300 Dysuria: Secondary | ICD-10-CM | POA: Diagnosis not present

## 2021-07-20 DIAGNOSIS — C189 Malignant neoplasm of colon, unspecified: Secondary | ICD-10-CM | POA: Diagnosis not present

## 2021-07-24 DIAGNOSIS — I890 Lymphedema, not elsewhere classified: Secondary | ICD-10-CM | POA: Diagnosis not present

## 2021-07-24 DIAGNOSIS — M79604 Pain in right leg: Secondary | ICD-10-CM | POA: Diagnosis not present

## 2021-07-24 DIAGNOSIS — M79605 Pain in left leg: Secondary | ICD-10-CM | POA: Diagnosis not present

## 2021-07-24 DIAGNOSIS — R2689 Other abnormalities of gait and mobility: Secondary | ICD-10-CM | POA: Diagnosis not present

## 2021-08-07 DIAGNOSIS — R2689 Other abnormalities of gait and mobility: Secondary | ICD-10-CM | POA: Diagnosis not present

## 2021-08-07 DIAGNOSIS — M79604 Pain in right leg: Secondary | ICD-10-CM | POA: Diagnosis not present

## 2021-08-07 DIAGNOSIS — M79605 Pain in left leg: Secondary | ICD-10-CM | POA: Diagnosis not present

## 2021-08-07 DIAGNOSIS — I890 Lymphedema, not elsewhere classified: Secondary | ICD-10-CM | POA: Diagnosis not present

## 2021-08-14 DIAGNOSIS — I890 Lymphedema, not elsewhere classified: Secondary | ICD-10-CM | POA: Diagnosis not present

## 2021-08-14 DIAGNOSIS — R2689 Other abnormalities of gait and mobility: Secondary | ICD-10-CM | POA: Diagnosis not present

## 2021-08-22 DIAGNOSIS — R262 Difficulty in walking, not elsewhere classified: Secondary | ICD-10-CM | POA: Diagnosis not present

## 2021-08-22 DIAGNOSIS — I890 Lymphedema, not elsewhere classified: Secondary | ICD-10-CM | POA: Diagnosis not present

## 2021-08-22 DIAGNOSIS — R2689 Other abnormalities of gait and mobility: Secondary | ICD-10-CM | POA: Diagnosis not present

## 2021-08-28 DIAGNOSIS — I890 Lymphedema, not elsewhere classified: Secondary | ICD-10-CM | POA: Diagnosis not present

## 2021-08-28 DIAGNOSIS — R262 Difficulty in walking, not elsewhere classified: Secondary | ICD-10-CM | POA: Diagnosis not present

## 2021-08-28 DIAGNOSIS — R2689 Other abnormalities of gait and mobility: Secondary | ICD-10-CM | POA: Diagnosis not present

## 2021-09-04 DIAGNOSIS — I890 Lymphedema, not elsewhere classified: Secondary | ICD-10-CM | POA: Diagnosis not present

## 2021-09-04 DIAGNOSIS — R2689 Other abnormalities of gait and mobility: Secondary | ICD-10-CM | POA: Diagnosis not present

## 2021-09-04 DIAGNOSIS — R262 Difficulty in walking, not elsewhere classified: Secondary | ICD-10-CM | POA: Diagnosis not present

## 2021-09-09 DIAGNOSIS — S0990XA Unspecified injury of head, initial encounter: Secondary | ICD-10-CM | POA: Diagnosis not present

## 2021-09-09 DIAGNOSIS — Z7982 Long term (current) use of aspirin: Secondary | ICD-10-CM | POA: Diagnosis not present

## 2021-09-09 DIAGNOSIS — R6889 Other general symptoms and signs: Secondary | ICD-10-CM | POA: Diagnosis not present

## 2021-09-09 DIAGNOSIS — M25511 Pain in right shoulder: Secondary | ICD-10-CM | POA: Diagnosis not present

## 2021-09-09 DIAGNOSIS — S42291A Other displaced fracture of upper end of right humerus, initial encounter for closed fracture: Secondary | ICD-10-CM | POA: Diagnosis not present

## 2021-09-09 DIAGNOSIS — W19XXXA Unspecified fall, initial encounter: Secondary | ICD-10-CM | POA: Diagnosis not present

## 2021-09-09 DIAGNOSIS — S42294A Other nondisplaced fracture of upper end of right humerus, initial encounter for closed fracture: Secondary | ICD-10-CM | POA: Diagnosis not present

## 2021-09-09 DIAGNOSIS — S59901A Unspecified injury of right elbow, initial encounter: Secondary | ICD-10-CM | POA: Diagnosis not present

## 2021-09-09 DIAGNOSIS — Z79899 Other long term (current) drug therapy: Secondary | ICD-10-CM | POA: Diagnosis not present

## 2021-09-09 DIAGNOSIS — Z951 Presence of aortocoronary bypass graft: Secondary | ICD-10-CM | POA: Diagnosis not present

## 2021-09-09 DIAGNOSIS — Z743 Need for continuous supervision: Secondary | ICD-10-CM | POA: Diagnosis not present

## 2021-09-09 DIAGNOSIS — Z23 Encounter for immunization: Secondary | ICD-10-CM | POA: Diagnosis not present

## 2021-09-09 DIAGNOSIS — Z885 Allergy status to narcotic agent status: Secondary | ICD-10-CM | POA: Diagnosis not present

## 2021-09-09 DIAGNOSIS — I10 Essential (primary) hypertension: Secondary | ICD-10-CM | POA: Diagnosis not present

## 2021-09-09 DIAGNOSIS — I25810 Atherosclerosis of coronary artery bypass graft(s) without angina pectoris: Secondary | ICD-10-CM | POA: Diagnosis not present

## 2021-09-09 DIAGNOSIS — M25519 Pain in unspecified shoulder: Secondary | ICD-10-CM | POA: Diagnosis not present

## 2021-09-09 DIAGNOSIS — M1991 Primary osteoarthritis, unspecified site: Secondary | ICD-10-CM | POA: Diagnosis not present

## 2021-09-11 DIAGNOSIS — I739 Peripheral vascular disease, unspecified: Secondary | ICD-10-CM | POA: Diagnosis not present

## 2021-09-11 DIAGNOSIS — I2510 Atherosclerotic heart disease of native coronary artery without angina pectoris: Secondary | ICD-10-CM | POA: Diagnosis not present

## 2021-09-11 DIAGNOSIS — E785 Hyperlipidemia, unspecified: Secondary | ICD-10-CM | POA: Diagnosis not present

## 2021-09-11 DIAGNOSIS — R5381 Other malaise: Secondary | ICD-10-CM | POA: Diagnosis not present

## 2021-09-11 DIAGNOSIS — M1990 Unspecified osteoarthritis, unspecified site: Secondary | ICD-10-CM | POA: Diagnosis not present

## 2021-09-11 DIAGNOSIS — I25119 Atherosclerotic heart disease of native coronary artery with unspecified angina pectoris: Secondary | ICD-10-CM | POA: Diagnosis not present

## 2021-09-11 DIAGNOSIS — S42294A Other nondisplaced fracture of upper end of right humerus, initial encounter for closed fracture: Secondary | ICD-10-CM | POA: Diagnosis not present

## 2021-09-11 DIAGNOSIS — K219 Gastro-esophageal reflux disease without esophagitis: Secondary | ICD-10-CM | POA: Diagnosis not present

## 2021-09-11 DIAGNOSIS — M1991 Primary osteoarthritis, unspecified site: Secondary | ICD-10-CM | POA: Diagnosis not present

## 2021-09-11 DIAGNOSIS — S4991XA Unspecified injury of right shoulder and upper arm, initial encounter: Secondary | ICD-10-CM | POA: Diagnosis not present

## 2021-09-11 DIAGNOSIS — I25810 Atherosclerosis of coronary artery bypass graft(s) without angina pectoris: Secondary | ICD-10-CM | POA: Diagnosis not present

## 2021-09-11 DIAGNOSIS — S42301A Unspecified fracture of shaft of humerus, right arm, initial encounter for closed fracture: Secondary | ICD-10-CM | POA: Diagnosis not present

## 2021-09-11 DIAGNOSIS — Z85038 Personal history of other malignant neoplasm of large intestine: Secondary | ICD-10-CM | POA: Diagnosis not present

## 2021-09-11 DIAGNOSIS — S42201A Unspecified fracture of upper end of right humerus, initial encounter for closed fracture: Secondary | ICD-10-CM | POA: Diagnosis not present

## 2021-09-11 DIAGNOSIS — R404 Transient alteration of awareness: Secondary | ICD-10-CM | POA: Diagnosis not present

## 2021-09-11 DIAGNOSIS — S42294D Other nondisplaced fracture of upper end of right humerus, subsequent encounter for fracture with routine healing: Secondary | ICD-10-CM | POA: Diagnosis not present

## 2021-09-11 DIAGNOSIS — Z79899 Other long term (current) drug therapy: Secondary | ICD-10-CM | POA: Diagnosis not present

## 2021-09-11 DIAGNOSIS — Z885 Allergy status to narcotic agent status: Secondary | ICD-10-CM | POA: Diagnosis not present

## 2021-09-11 DIAGNOSIS — I10 Essential (primary) hypertension: Secondary | ICD-10-CM | POA: Diagnosis not present

## 2021-09-11 DIAGNOSIS — Z23 Encounter for immunization: Secondary | ICD-10-CM | POA: Diagnosis not present

## 2021-09-11 DIAGNOSIS — I890 Lymphedema, not elsewhere classified: Secondary | ICD-10-CM | POA: Diagnosis not present

## 2021-09-11 DIAGNOSIS — Z951 Presence of aortocoronary bypass graft: Secondary | ICD-10-CM | POA: Diagnosis not present

## 2021-09-11 DIAGNOSIS — I878 Other specified disorders of veins: Secondary | ICD-10-CM | POA: Diagnosis not present

## 2021-09-11 DIAGNOSIS — Z7982 Long term (current) use of aspirin: Secondary | ICD-10-CM | POA: Diagnosis not present

## 2021-09-11 DIAGNOSIS — F32A Depression, unspecified: Secondary | ICD-10-CM | POA: Diagnosis not present

## 2021-09-14 DIAGNOSIS — I890 Lymphedema, not elsewhere classified: Secondary | ICD-10-CM | POA: Diagnosis not present

## 2021-09-14 DIAGNOSIS — S4991XA Unspecified injury of right shoulder and upper arm, initial encounter: Secondary | ICD-10-CM | POA: Diagnosis not present

## 2021-09-14 DIAGNOSIS — E785 Hyperlipidemia, unspecified: Secondary | ICD-10-CM | POA: Diagnosis not present

## 2021-09-14 DIAGNOSIS — S42301A Unspecified fracture of shaft of humerus, right arm, initial encounter for closed fracture: Secondary | ICD-10-CM | POA: Diagnosis not present

## 2021-09-14 DIAGNOSIS — M1990 Unspecified osteoarthritis, unspecified site: Secondary | ICD-10-CM | POA: Diagnosis not present

## 2021-09-14 DIAGNOSIS — I25119 Atherosclerotic heart disease of native coronary artery with unspecified angina pectoris: Secondary | ICD-10-CM | POA: Diagnosis not present

## 2021-09-14 DIAGNOSIS — I10 Essential (primary) hypertension: Secondary | ICD-10-CM | POA: Diagnosis not present

## 2021-09-14 DIAGNOSIS — Z85038 Personal history of other malignant neoplasm of large intestine: Secondary | ICD-10-CM | POA: Diagnosis not present

## 2021-09-14 DIAGNOSIS — S42201A Unspecified fracture of upper end of right humerus, initial encounter for closed fracture: Secondary | ICD-10-CM | POA: Diagnosis not present

## 2021-09-17 DIAGNOSIS — I25119 Atherosclerotic heart disease of native coronary artery with unspecified angina pectoris: Secondary | ICD-10-CM | POA: Diagnosis not present

## 2021-09-17 DIAGNOSIS — R404 Transient alteration of awareness: Secondary | ICD-10-CM | POA: Diagnosis not present

## 2021-09-17 DIAGNOSIS — I10 Essential (primary) hypertension: Secondary | ICD-10-CM | POA: Diagnosis not present

## 2021-09-17 DIAGNOSIS — S42301A Unspecified fracture of shaft of humerus, right arm, initial encounter for closed fracture: Secondary | ICD-10-CM | POA: Diagnosis not present

## 2021-09-17 DIAGNOSIS — M1990 Unspecified osteoarthritis, unspecified site: Secondary | ICD-10-CM | POA: Diagnosis not present

## 2021-09-17 DIAGNOSIS — I890 Lymphedema, not elsewhere classified: Secondary | ICD-10-CM | POA: Diagnosis not present

## 2021-09-17 DIAGNOSIS — E785 Hyperlipidemia, unspecified: Secondary | ICD-10-CM | POA: Diagnosis not present

## 2021-09-17 DIAGNOSIS — R5381 Other malaise: Secondary | ICD-10-CM | POA: Diagnosis not present

## 2023-12-20 DEATH — deceased
